# Patient Record
Sex: Female | Born: 1980
Health system: Southern US, Community
[De-identification: ages and names within clinical notes are randomized; demographics above are authoritative.]

## PROBLEM LIST (undated history)

## (undated) DIAGNOSIS — R87619 Unspecified abnormal cytological findings in specimens from cervix uteri: Secondary | ICD-10-CM

## (undated) DIAGNOSIS — IMO0002 Reserved for concepts with insufficient information to code with codable children: Secondary | ICD-10-CM

## (undated) DIAGNOSIS — K649 Unspecified hemorrhoids: Secondary | ICD-10-CM

## (undated) DIAGNOSIS — M533 Sacrococcygeal disorders, not elsewhere classified: Secondary | ICD-10-CM

## (undated) DIAGNOSIS — Z3480 Encounter for supervision of other normal pregnancy, unspecified trimester: Secondary | ICD-10-CM

## (undated) HISTORY — DX: Unspecified hemorrhoids: K64.9

## (undated) HISTORY — DX: Reserved for concepts with insufficient information to code with codable children: IMO0002

## (undated) HISTORY — DX: Sacrococcygeal disorders, not elsewhere classified: M53.3

## (undated) HISTORY — PX: COLPOSCOPY: SHX161

## (undated) HISTORY — PX: ADENOIDECTOMY: SUR15

## (undated) HISTORY — DX: Unspecified abnormal cytological findings in specimens from cervix uteri: R87.619

---

## 2011-04-23 ENCOUNTER — Inpatient Hospital Stay (HOSPITAL_COMMUNITY): Admission: AD | Admit: 2011-04-23 | Payer: Self-pay | Source: Ambulatory Visit | Admitting: Obstetrics and Gynecology

## 2011-08-10 LAB — OB RESULTS CONSOLE HIV ANTIBODY (ROUTINE TESTING): HIV: NONREACTIVE

## 2011-08-10 LAB — OB RESULTS CONSOLE GC/CHLAMYDIA
Chlamydia: NEGATIVE
Gonorrhea: NEGATIVE

## 2011-08-10 LAB — OB RESULTS CONSOLE RPR: RPR: NONREACTIVE

## 2012-01-24 LAB — OB RESULTS CONSOLE GBS: GBS: NEGATIVE

## 2012-02-07 ENCOUNTER — Telehealth (HOSPITAL_COMMUNITY): Payer: Self-pay | Admitting: *Deleted

## 2012-02-07 ENCOUNTER — Encounter (HOSPITAL_COMMUNITY): Payer: Self-pay | Admitting: *Deleted

## 2012-02-07 NOTE — Telephone Encounter (Signed)
Preadmission screen  

## 2012-02-10 ENCOUNTER — Telehealth (HOSPITAL_COMMUNITY): Payer: Self-pay | Admitting: *Deleted

## 2012-02-10 ENCOUNTER — Encounter (HOSPITAL_COMMUNITY): Payer: Self-pay | Admitting: *Deleted

## 2012-02-10 NOTE — Telephone Encounter (Signed)
Preadmission screen  

## 2012-02-16 ENCOUNTER — Encounter (HOSPITAL_COMMUNITY): Payer: Self-pay | Admitting: *Deleted

## 2012-02-16 ENCOUNTER — Inpatient Hospital Stay (HOSPITAL_COMMUNITY): Payer: No Typology Code available for payment source | Admitting: Anesthesiology

## 2012-02-16 ENCOUNTER — Inpatient Hospital Stay (HOSPITAL_COMMUNITY)
Admission: AD | Admit: 2012-02-16 | Discharge: 2012-02-18 | DRG: 775 | Disposition: A | Discharge status: Home / Self Care | Payer: No Typology Code available for payment source | Source: Ambulatory Visit | Attending: Obstetrics and Gynecology | Admitting: Obstetrics and Gynecology

## 2012-02-16 ENCOUNTER — Encounter (HOSPITAL_COMMUNITY): Payer: Self-pay | Admitting: Anesthesiology

## 2012-02-16 DIAGNOSIS — Z3480 Encounter for supervision of other normal pregnancy, unspecified trimester: Secondary | ICD-10-CM

## 2012-02-16 HISTORY — DX: Encounter for supervision of other normal pregnancy, unspecified trimester: Z34.80

## 2012-02-16 LAB — CBC
MCV: 89.4 fL (ref 78.0–100.0)
Platelets: 173 10*3/uL (ref 150–400)
RBC: 3.59 MIL/uL — ABNORMAL LOW (ref 3.87–5.11)
RDW: 13.5 % (ref 11.5–15.5)
WBC: 8.7 10*3/uL (ref 4.0–10.5)

## 2012-02-16 LAB — POCT FERN TEST: POCT Fern Test: POSITIVE

## 2012-02-16 MED ORDER — ACETAMINOPHEN 325 MG PO TABS: 650.0000 mg | ORAL_TABLET | ORAL | Status: DC | PRN

## 2012-02-16 MED ORDER — WITCH HAZEL-GLYCERIN EX PADS
1.0000 "application " | MEDICATED_PAD | CUTANEOUS | Status: DC | PRN
  Administered 2012-02-16 – 2012-02-18 (×2): 1 via TOPICAL

## 2012-02-16 MED ORDER — LACTATED RINGERS IV SOLN
500.0000 mL | INTRAVENOUS | Status: DC | PRN
  Administered 2012-02-16: 1000 mL via INTRAVENOUS

## 2012-02-16 MED ORDER — BENZOCAINE-MENTHOL 20-0.5 % EX AERO
1.0000 "application " | INHALATION_SPRAY | CUTANEOUS | Status: DC | PRN
  Administered 2012-02-16: 1 via TOPICAL
  Filled 2012-02-16: qty 56

## 2012-02-16 MED ORDER — OXYCODONE-ACETAMINOPHEN 5-325 MG PO TABS
1.0000 | ORAL_TABLET | ORAL | Status: DC | PRN
  Administered 2012-02-17 – 2012-02-18 (×2): 1 via ORAL
  Filled 2012-02-16 (×2): qty 1

## 2012-02-16 MED ORDER — OXYTOCIN 40 UNITS IN LACTATED RINGERS INFUSION - SIMPLE MED: 62.5000 mL/h | INTRAVENOUS | Status: AC | PRN

## 2012-02-16 MED ORDER — PSEUDOEPHEDRINE HCL 30 MG PO TABS: 30.0000 mg | ORAL_TABLET | Freq: Every day | ORAL | Status: DC | PRN

## 2012-02-16 MED ORDER — DIPHENHYDRAMINE HCL 25 MG PO CAPS: 25.0000 mg | ORAL_CAPSULE | Freq: Four times a day (QID) | ORAL | Status: DC | PRN

## 2012-02-16 MED ORDER — OXYCODONE-ACETAMINOPHEN 5-325 MG PO TABS: 1.0000 | ORAL_TABLET | ORAL | Status: DC | PRN

## 2012-02-16 MED ORDER — SENNOSIDES-DOCUSATE SODIUM 8.6-50 MG PO TABS
2.0000 | ORAL_TABLET | Freq: Every day | ORAL | Status: DC
  Administered 2012-02-17 (×2): 2 via ORAL

## 2012-02-16 MED ORDER — ONDANSETRON HCL 4 MG PO TABS: 4.0000 mg | ORAL_TABLET | ORAL | Status: DC | PRN

## 2012-02-16 MED ORDER — ONDANSETRON HCL 4 MG/2ML IJ SOLN: 4.0000 mg | INTRAMUSCULAR | Status: DC | PRN

## 2012-02-16 MED ORDER — EPHEDRINE 5 MG/ML INJ
10.0000 mg | INTRAVENOUS | Status: DC | PRN
  Filled 2012-02-16: qty 4

## 2012-02-16 MED ORDER — OXYTOCIN 40 UNITS IN LACTATED RINGERS INFUSION - SIMPLE MED
1.0000 m[IU]/min | INTRAVENOUS | Status: DC
  Administered 2012-02-16: 2 m[IU]/min via INTRAVENOUS

## 2012-02-16 MED ORDER — LANOLIN HYDROUS EX OINT: TOPICAL_OINTMENT | CUTANEOUS | Status: DC | PRN

## 2012-02-16 MED ORDER — OXYTOCIN 40 UNITS IN LACTATED RINGERS INFUSION - SIMPLE MED
62.5000 mL/h | INTRAVENOUS | Status: DC
  Filled 2012-02-16: qty 1000

## 2012-02-16 MED ORDER — ZOLPIDEM TARTRATE 5 MG PO TABS: 5.0000 mg | ORAL_TABLET | Freq: Every evening | ORAL | Status: DC | PRN

## 2012-02-16 MED ORDER — PRENATAL MULTIVITAMIN CH
1.0000 | ORAL_TABLET | Freq: Every day | ORAL | Status: DC
  Administered 2012-02-17 – 2012-02-18 (×2): 1 via ORAL
  Filled 2012-02-16 (×2): qty 1

## 2012-02-16 MED ORDER — FLEET ENEMA 7-19 GM/118ML RE ENEM: 1.0000 | ENEMA | Freq: Once | RECTAL | Status: DC

## 2012-02-16 MED ORDER — IBUPROFEN 600 MG PO TABS: 600.0000 mg | ORAL_TABLET | Freq: Four times a day (QID) | ORAL | Status: DC | PRN

## 2012-02-16 MED ORDER — DIBUCAINE 1 % RE OINT: 1.0000 "application " | TOPICAL_OINTMENT | RECTAL | Status: DC | PRN

## 2012-02-16 MED ORDER — LIDOCAINE HCL (PF) 1 % IJ SOLN: 30.0000 mL | INTRAMUSCULAR | Status: DC | PRN

## 2012-02-16 MED ORDER — DIPHENHYDRAMINE HCL 50 MG/ML IJ SOLN: 12.5000 mg | INTRAMUSCULAR | Status: DC | PRN

## 2012-02-16 MED ORDER — PHENYLEPHRINE 40 MCG/ML (10ML) SYRINGE FOR IV PUSH (FOR BLOOD PRESSURE SUPPORT): 80.0000 ug | PREFILLED_SYRINGE | INTRAVENOUS | Status: DC | PRN

## 2012-02-16 MED ORDER — LACTATED RINGERS IV SOLN: 500.0000 mL | Freq: Once | INTRAVENOUS | Status: DC

## 2012-02-16 MED ORDER — IBUPROFEN 600 MG PO TABS
600.0000 mg | ORAL_TABLET | Freq: Four times a day (QID) | ORAL | Status: DC
  Administered 2012-02-16 – 2012-02-18 (×7): 600 mg via ORAL
  Filled 2012-02-16 (×8): qty 1

## 2012-02-16 MED ORDER — SIMETHICONE 80 MG PO CHEW: 80.0000 mg | CHEWABLE_TABLET | ORAL | Status: DC | PRN

## 2012-02-16 MED ORDER — BUTORPHANOL TARTRATE 1 MG/ML IJ SOLN: 1.0000 mg | INTRAMUSCULAR | Status: DC | PRN

## 2012-02-16 MED ORDER — PHENYLEPHRINE 40 MCG/ML (10ML) SYRINGE FOR IV PUSH (FOR BLOOD PRESSURE SUPPORT)
80.0000 ug | PREFILLED_SYRINGE | INTRAVENOUS | Status: DC | PRN
  Filled 2012-02-16: qty 5

## 2012-02-16 MED ORDER — CITRIC ACID-SODIUM CITRATE 334-500 MG/5ML PO SOLN: 30.0000 mL | ORAL | Status: DC | PRN

## 2012-02-16 MED ORDER — TERBUTALINE SULFATE 1 MG/ML IJ SOLN: 0.2500 mg | Freq: Once | INTRAMUSCULAR | Status: DC | PRN

## 2012-02-16 MED ORDER — FENTANYL 2.5 MCG/ML BUPIVACAINE 1/10 % EPIDURAL INFUSION (WH - ANES)
14.0000 mL/h | INTRAMUSCULAR | Status: DC
  Administered 2012-02-16: 14 mL/h via EPIDURAL
  Filled 2012-02-16: qty 125

## 2012-02-16 MED ORDER — ONDANSETRON HCL 4 MG/2ML IJ SOLN: 4.0000 mg | Freq: Four times a day (QID) | INTRAMUSCULAR | Status: DC | PRN

## 2012-02-16 MED ORDER — OXYTOCIN BOLUS FROM INFUSION: 500.0000 mL | INTRAVENOUS | Status: DC

## 2012-02-16 MED ORDER — TETANUS-DIPHTH-ACELL PERTUSSIS 5-2.5-18.5 LF-MCG/0.5 IM SUSP: 0.5000 mL | Freq: Once | INTRAMUSCULAR | Status: DC

## 2012-02-16 MED ORDER — MEASLES, MUMPS & RUBELLA VAC ~~LOC~~ INJ
0.5000 mL | INJECTION | Freq: Once | SUBCUTANEOUS | Status: DC
  Filled 2012-02-16: qty 0.5

## 2012-02-16 MED ORDER — LIDOCAINE HCL (PF) 1 % IJ SOLN
INTRAMUSCULAR | Status: DC | PRN
  Administered 2012-02-16 (×4): 4 mL

## 2012-02-16 MED ORDER — LACTATED RINGERS IV SOLN: INTRAVENOUS | Status: AC

## 2012-02-16 MED ORDER — LACTATED RINGERS IV SOLN
INTRAVENOUS | Status: DC
  Administered 2012-02-16 (×2): via INTRAVENOUS

## 2012-02-16 MED ORDER — PRENATAL MULTIVITAMIN CH: 1.0000 | ORAL_TABLET | Freq: Every day | ORAL | Status: DC

## 2012-02-16 NOTE — Anesthesia Postprocedure Evaluation (Signed)
  Anesthesia Post-op Note  Patient: Yolanda Christian  Procedure(s) Performed: * No procedures listed *  Patient Location: PACU and Mother/Baby  Anesthesia Type:Epidural  Level of Consciousness: awake, alert  and oriented  Airway and Oxygen Therapy: Patient Spontanous Breathing  Post-op Pain: mild  Post-op Assessment: Post-op Vital signs reviewed  Post-op Vital Signs: Reviewed and stable  Complications: No apparent anesthesia complications

## 2012-02-16 NOTE — Progress Notes (Signed)
Patient ID: Yolanda Christian, female   DOB: 1981-01-31, 31 y.o.   MRN: [id number] Contractions q 1-2 and 1/2 minutes. The cervix is 6 cm 100% effaced and the vertex is at - 1 station.

## 2012-02-16 NOTE — Progress Notes (Signed)
Patient ID: Yolanda Christian, female   DOB: 05-15-1980, 31 y.o.   MRN: [id number] Contractions q 3-4 minutes and pt has her epidural. The cervix is 4 cm 80% effaced and the vertex is at - 2 station. The cervix was 3-4 cm and 75 % effaced on admission at 2:30 AM so will start pitoocin

## 2012-02-16 NOTE — Progress Notes (Signed)
Patient ID: Yolanda Christian, female   DOB: [birth year], 31 y.o.   MRN: [id number] Delivery note.   The pt gradually brought the vertex to the perineum and delivered a living female infant LOP over a small second degree laceratiion. The Apgars were 9 and 9 at 1 and 5 minutes and the placenta delivered intact. The uterus was normal and the small laceration was repaired with 3-0 vicryl EBL 400 cc's. During labor it was noted that there is redundant tissue on the left perineum

## 2012-02-16 NOTE — Progress Notes (Signed)
Patient ID: Yolanda Christian, female   DOB: 11-May-1980, 31 y.o.   MRN: [id number] Py became fully dilated at 10:20 AM and is pushing with minimal descent so far.

## 2012-02-16 NOTE — Anesthesia Preprocedure Evaluation (Signed)

## 2012-02-16 NOTE — H&P (Signed)
Yolanda Christian is a 31 y.o. female G2P1001 at 39+ with SROM, clear fluid.  Uncomplicated prenatal care except isolated EIF on [place].  +FM, + LOF, no VB, occ ctx.   Maternal Medical History:  Reason for admission: Reason for admission: rupture of membranes.  Contractions: Frequency: irregular.    Fetal activity: Perceived fetal activity is normal.      OB History    Grav Para Term Preterm Abortions TAB SAB Ect Mult Living   2 1 1       1     G1 7#5, female, SVD post-dates; G2 present, + abn pap, no STDs. Past Medical History  Diagnosis Date  . Abnormal Pap smear   . Hemorrhoids   . Coccyx disorder     positional tender on/off  . Normal pregnancy, repeat 02/16/2012   Past Surgical History  Procedure Date  . Colposcopy   . Adenoidectomy    Family History: family history includes Cancer in her maternal grandfather; Diabetes in her father; and Hypertension in her father. Social History:  reports that she has never smoked. She has never used smokeless tobacco. She reports that she does not drink alcohol or use illicit drugs.married Meds PNV All NKDA   Prenatal Transfer Tool  Maternal Diabetes: No Genetic Screening: Declined Maternal Ultrasounds/Referrals: Abnormal:  Findings:   Isolated EIF (echogenic intracardiac focus) Fetal Ultrasounds or other Referrals:  None Maternal Substance Abuse:  No Significant Maternal Medications:  None Significant Maternal Lab Results:  Lab values include: Group B Strep negative Other Comments:  None  Review of Systems  Constitutional: Negative.   HENT: Negative.   Eyes: Negative.   Respiratory: Negative.   Cardiovascular: Negative.   Gastrointestinal: Negative.   Genitourinary: Negative.   Musculoskeletal: Negative.   Skin: Negative.   Neurological: Negative.   Psychiatric/Behavioral: Negative.     Dilation: 3.5 Effacement (%): 70 Station: -2 Exam by:: M.Topp,RN Blood pressure 138/78, pulse 87, temperature 98 F (36.7 C), resp. rate  20, height 5\' 7"  (1.702 m), weight 81.647 kg (180 lb), last menstrual period 05/26/2011, SpO2 100.00%. Maternal Exam:  Uterine Assessment: Contraction strength is moderate.  Contraction frequency is irregular.   Abdomen: Fundal height is appropriate for gestation.   Estimated fetal weight is 7-8#.   Fetal presentation: vertex  Introitus: Normal vulva. Normal vagina.  Ferning test: positive.   Pelvis: adequate for delivery.      Physical Exam  Constitutional: She is oriented to person, place, and time. She appears well-developed and well-nourished.  HENT:  Head: Normocephalic and atraumatic.  Eyes: Pupils are equal, round, and reactive to light.  Neck: Normal range of motion. Neck supple. No thyromegaly present.  Cardiovascular: Normal rate and regular rhythm.   Respiratory: Effort normal and breath sounds normal. No respiratory distress.  GI: Soft. Bowel sounds are normal. There is no tenderness.  Musculoskeletal: Normal range of motion.  Neurological: She is alert and oriented to person, place, and time.  Skin: Skin is warm and dry.  Psychiatric: She has a normal mood and affect. Her behavior is normal.    Prenatal labs: ABO, Rh: O/Positive/-- (05/14 0000) Antibody: Negative (05/14 0000) Rubella: Immune (05/14 0000) RPR: Nonreactive (05/14 0000)  HBsAg: Negative (05/14 0000)  HIV: Non-reactive (05/14 0000)  GBS: Negative (10/28 0000)  Hgb 12.8/ Pap WNL/ Ur Cx neg/ Plt 287K/ GC neg/ Chl neg/ glucola 117/  Flu 9/27; Tdap 10/13  [place] to date, first trimester Anat - isolated EIF, nl anat, cwd, female, post plac  Assessment/Plan: 31yo G2P1001 with SROM for augmentation of labor Pitocin prn Epidural prn Expect SVD    BOVARD,Jenean Escandon 02/16/2012, 6:53 AM

## 2012-02-16 NOTE — Anesthesia Procedure Notes (Addendum)
Epidural Patient location during procedure: OB Start time: 02/16/2012 7:01 AM  Staffing Performed by: anesthesiologist   Preanesthetic Checklist Completed: patient identified, site marked, surgical consent, pre-op evaluation, timeout performed, IV checked, risks and benefits discussed and monitors and equipment checked  Epidural Patient position: sitting Prep: site prepped and draped and DuraPrep Patient monitoring: continuous pulse ox and blood pressure Approach: midline Injection technique: LOR air  Needle:  Needle type: Tuohy  Needle gauge: 17 G Needle length: 9 cm and 9 Needle insertion depth: 5 cm cm Catheter type: closed end flexible Catheter size: 19 Gauge Catheter at skin depth: 10 cm Test dose: negative  Assessment Events: blood not aspirated, injection not painful, no injection resistance, negative IV test and paresthesia (transient left leg)  Additional Notes Discussed risk of headache, infection, bleeding, nerve injury and failed or incomplete block.  Patient voices understanding and wishes to proceed. Reason for block:procedure for pain

## 2012-02-17 LAB — CBC
HCT: 29.9 % — ABNORMAL LOW (ref 36.0–46.0)
MCH: 29.4 pg (ref 26.0–34.0)
MCV: 91.4 fL (ref 78.0–100.0)
Platelets: 135 10*3/uL — ABNORMAL LOW (ref 150–400)
RDW: 13.8 % (ref 11.5–15.5)
WBC: 9.1 10*3/uL (ref 4.0–10.5)

## 2012-02-17 NOTE — Progress Notes (Signed)
Patient ID: Yolanda Christian, female   DOB: January 17, 1981, 31 y.o.   MRN: [id number] #1 afebrile BP normal

## 2012-02-17 NOTE — Progress Notes (Signed)
Patient ID: Yolanda Christian, female   DOB: Jul 13, 1980, 31 y.o.   MRN: [id number] #1 afebrile BP normal no problems

## 2012-02-18 ENCOUNTER — Inpatient Hospital Stay (HOSPITAL_COMMUNITY): Admission: RE | Admit: 2012-02-18 | Payer: No Typology Code available for payment source | Source: Ambulatory Visit

## 2012-02-18 MED ORDER — IBUPROFEN 600 MG PO TABS: 600.0000 mg | ORAL_TABLET | Freq: Four times a day (QID) | ORAL | Status: DC

## 2012-02-18 MED ORDER — OXYCODONE-ACETAMINOPHEN 5-325 MG PO TABS: 1.0000 | ORAL_TABLET | Freq: Four times a day (QID) | ORAL | Status: DC | PRN

## 2012-02-18 NOTE — Discharge Summary (Signed)
NAMEMarland Kitchen  KOBIE, MATKINS NO.:  [id number]  MEDICAL RECORD NO.:  [id number]  LOCATION:  9110                          FACILITY:  [facility]  PHYSICIAN:  Malachi Pro. Ambrose Mantle, M.D. DATE OF BIRTH:  1981/02/14  DATE OF ADMISSION:  02/16/2012 DATE OF DISCHARGE:  02/18/2012                              DISCHARGE SUMMARY   A 31 year old white female, para 1-0-0-1, gravida 2, presented with spontaneous rupture of membranes and clear fluid.  The patient had an uncomplicated prenatal course.  She did have an echogenic intracardiac focus on ultrasound.  Blood group and type O positive, negative antibody, rubella immune, RPR nonreactive, hepatitis B surface antigen negative, HIV negative, GBS negative.  One hour Glucola 117.  GC and Chlamydia negative.  Pap smear normal.  After admission to the hospital, the patient was placed on Pitocin.  By 8:12 a.m., the contractions every 3-4 minutes.  Cervix 4 cm, 80%, vertex at a -2.  Cervix had been 3-4 cm and 75%.  On admission at 2:30 a.m., the Pitocin was begun.  At 9 a.m. the cervix was 6 cm, 100% vertex at a -1.  She became fully dilated at 10:20 a.m. and began pushing and initially made an minimal descent that she gradually brought the vertex to the perineum, delivered a living female infant, LOP over a small second-degree laceration by Dr. Ambrose Mantle. Apgars were 9 and 9 at 1 and 5 minutes.  Placenta delivered intact. Uterus normal and the small laceration was repaired with 3-0 Vicryl. Blood loss 400 mL.  During labor, it was noted that there was redundant tissue on the left side of the perineum.  Postpartum the patient did well and was discharged on the second postpartum day.  LABORATORY DATA:  Initial hemoglobin of 10.5, hematocrit 32.1, white count 8700, platelet count of 173,000, RPR nonreactive, followup hemoglobin 9.6, hematocrit 29.9.  FINAL DIAGNOSES:  Intrauterine pregnancy 39+ weeks, delivered left occiput posterior.  OPERATIONS:   Spontaneous delivery LOP, repair of small second-degree laceration.  FINAL CONDITION:  Improved.  Instructions include our regular discharge instruction booklet as well as the after visit summary.  She is given prescriptions for Motrin and Percocet 15 of each, 1 every 6 hours as needed for pain and she is advised to take iron pills once daily and return in 6 weeks for followup examination.     Malachi Pro. Ambrose Mantle, M.D.     TFH/MEDQ  D:  02/18/2012  T:  02/18/2012  Job:  [id number]

## 2012-02-18 NOTE — Progress Notes (Signed)
Patient ID: Yolanda Christian, female   DOB: [birth year], 31 y.o.   MRN: [id number] #2 afebrile for d/c No complaints

## 2012-02-21 ENCOUNTER — Encounter (HOSPITAL_COMMUNITY): Payer: Self-pay | Admitting: *Deleted

## 2013-08-31 ENCOUNTER — Emergency Department (INDEPENDENT_AMBULATORY_CARE_PROVIDER_SITE_OTHER)
Admission: EM | Admit: 2013-08-31 | Discharge: 2013-08-31 | Disposition: A | Discharge status: Home / Self Care | Payer: BC Managed Care – PPO | Source: Home / Self Care | Attending: Emergency Medicine | Admitting: Emergency Medicine

## 2013-08-31 ENCOUNTER — Encounter (HOSPITAL_COMMUNITY): Payer: Self-pay | Admitting: Emergency Medicine

## 2013-08-31 DIAGNOSIS — M26649 Arthritis of unspecified temporomandibular joint: Secondary | ICD-10-CM

## 2013-08-31 DIAGNOSIS — M2669 Other specified disorders of temporomandibular joint: Secondary | ICD-10-CM

## 2013-08-31 MED ORDER — DICLOFENAC SODIUM 75 MG PO TBEC: 75.0000 mg | DELAYED_RELEASE_TABLET | Freq: Two times a day (BID) | ORAL | Status: DC

## 2013-08-31 MED ORDER — CYCLOBENZAPRINE HCL 10 MG PO TABS: ORAL_TABLET | ORAL | Status: DC

## 2013-08-31 MED ORDER — TRIAMCINOLONE ACETONIDE 0.1 % EX CREA: 1.0000 "application " | TOPICAL_CREAM | Freq: Three times a day (TID) | CUTANEOUS | Status: DC

## 2013-08-31 MED ORDER — PREDNISONE 20 MG PO TABS: 20.0000 mg | ORAL_TABLET | Freq: Two times a day (BID) | ORAL | Status: DC

## 2013-08-31 NOTE — Discharge Instructions (Signed)
Temporomandibular Problems  Temporomandibular joint (TMJ) dysfunction means there are problems with the joint between your jaw and your skull. This is a joint lined by cartilage like other joints in your body but also has a small disc in the joint which keeps the bones from rubbing on each other. These joints are like other joints and can get inflamed (sore) from arthritis and other problems. When this joint gets sore, it can cause headaches and pain in the jaw and the face. CAUSES  Usually the arthritic types of problems are caused by soreness in the joint. Soreness in the joint can also be caused by overuse. This may come from grinding your teeth. It may also come from mis-alignment in the joint. DIAGNOSIS Diagnosis of this condition can often be made by history and exam. Sometimes your caregiver may need X-rays or an MRI scan to determine the exact cause. It may be necessary to see your dentist to determine if your teeth and jaws are lined up correctly. TREATMENT  Most of the time this problem is not serious; however, sometimes it can persist (become chronic). When this happens medications that will cut down on inflammation (soreness) help. Sometimes a shot of cortisone into the joint will be helpful. If your teeth are not aligned it may help for your dentist to make a splint for your mouth that can help this problem. If no physical problems can be found, the problem may come from tension. If tension is found to be the cause, biofeedback or relaxation techniques may be helpful. HOME CARE INSTRUCTIONS   Later in the day, applications of ice packs may be helpful. Ice can be used in a plastic bag with a towel around it to prevent frostbite to skin. This may be used about every 2 hours for 20 to 30 minutes, as needed while awake, or as directed by your caregiver.  Only take over-the-counter or prescription medicines for pain, discomfort, or fever as directed by your caregiver.  If physical therapy was  prescribed, follow your caregiver's directions.  Wear mouth appliances as directed if they were given. Document Released: 12/08/2000 Document Revised: 06/07/2011 Document Reviewed: 03/17/2008 [facility] Patient Information 2014 [place], [place].    Jaw Range of Motion Exercises Do the exercises below to prevent problems opening your mouth after a jaw fracture, TMJ, or surgery. HOME CARE Warm up  Open your mouth as wide as possible for 15 seconds.  Then, close your mouth and rest. Repeat this 8 times. Exercises  Stick your jaw forward for 15 seconds. Rest your jaw. Repeat this 8 times.  Move your jaw right for 15 seconds. Rest your jaw and repeat 8 times.  Move your jaw left for 15 seconds. Rest your jaw and repeat 8 times.  Put your middle finger and thumb in your mouth. Push with your thumb on your top teeth and your middle finger on your bottom teeth opening your mouth.  Stretch your mouth open as far as possible. Repeat 8 times.  Chew gum when you are not doing exercises.  Use moist heat packs 3 to 4 times a day on your jaw area. Document Released: 02/26/2008 Document Revised: 06/07/2011 Document Reviewed: 02/26/2008 [facility] Patient Information 2014 [place].

## 2013-08-31 NOTE — ED Provider Notes (Signed)
Chief Complaint   Chief Complaint  Patient presents with  . Jaw Pain    History of Present Illness   Yolanda Christian is a 33 year old female who has had a two-day history of pain and swelling around the right TMJ. She denies any injury to the area. There is no erythema, fever, chills, headache, or stiff neck. It does not hurt to open or close her mouth and she has no pain with chewing or biting. She denies any ear pain or drainage. There no painful teeth and no swelling inside the mouth. She denies any sore throat or adenopathy. She's had a long-standing history of a click in her right jaw, but has never given her much problem the past.  Review of Systems   Other than as noted above, the patient denies any of the following symptoms: Systemic:  No fevers or chills. Eye:  No redness, pain, discharge, itching, blurred vision, or diplopia. ENT:  No headache, nasal congestion, sneezing, itching, epistaxis, ear pain, decreased hearing, ringing in ears, vertigo, or tinnitus.  No oral lesions, sore throat, or hoarseness. Neck:  No neck pain or adenopathy. Skin:  No rash or itching.  [place]   Past medical history, family history, social history, meds, and allergies were reviewed.   Physical Examination     Vital signs:  BP 108/73  Pulse 71  Temp(Src) 97.7 F (36.5 C) (Oral)  Resp 18  SpO2 100%  LMP 08/25/2013  Breastfeeding? No General:  Alert and oriented.  In no distress.  Skin warm and dry. Eye:  PERRL, full EOMs, lids and conjunctiva normal.   ENT:  TMs and canals clear.  Nasal mucosa not congested and without drainage.  Mucous membranes moist, no oral lesions, normal dentition, pharynx clear.  No cranial or facial pain to palplation. Exam of the jaw area reveals no external swelling, no mass, the parotid gland is not enlarged. There is slight tenderness to palpation over the right TMJ and she has a loud, palpable click. She's able to open her mouth fully. There no intraoral lesions,  no obviously decayed teeth. Neck:  Supple, full ROM.  No adenopathy, tenderness or mass.  Thyroid normal. Lungs:  Breath sounds clear and equal bilaterally.  No wheezes, rales or rhonchi. Heart:  Rhythm regular, without extrasystoles.  No gallops or murmers. Skin:  Clear, warm and dry. She had an erythematous rash on her right arm. She attributes this to poison ivy.  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  Plan    1.  Meds:  The following meds were prescribed:   Discharge Medication List as of 08/31/2013  7:18 PM    START taking these medications   Details  cyclobenzaprine (FLEXERIL) 10 MG tablet Take 1 at bedtime, Normal    diclofenac (VOLTAREN) 75 MG EC tablet Take 1 tablet (75 mg total) by mouth 2 (two) times daily., Starting 08/31/2013, Until Discontinued, Normal    predniSONE (DELTASONE) 20 MG tablet Take 1 tablet (20 mg total) by mouth 2 (two) times daily., Starting 08/31/2013, Until Discontinued, Normal    triamcinolone cream (KENALOG) 0.1 % Apply 1 application topically 3 (three) times daily., Starting 08/31/2013, Until Discontinued, Normal        2.  Patient Education/Counseling:  The patient was given appropriate handouts, self care instructions, and instructed in symptomatic relief.  Suggested soft diet, range of motion exercises, and followup with her dentist in a week.  3.  Follow up:  The patient was told  to follow up here if no better in 3 to 4 days, or sooner if becoming worse in any way, and given some red flag symptoms such as fever, swelling, erythema, or worsening pain which would prompt immediate return.       Harden Mo, MD 08/31/13 2138

## 2013-08-31 NOTE — ED Notes (Signed)
Pt c/o constant pain on right side of jaw onset 2 days Sx include swelling Does not hurt to eat Taking ibup w/no relief; last had around 1800

## 2014-01-15 LAB — OB RESULTS CONSOLE GC/CHLAMYDIA
Chlamydia: NEGATIVE
Gonorrhea: NEGATIVE

## 2014-01-21 LAB — OB RESULTS CONSOLE HEPATITIS B SURFACE ANTIGEN: HEP B S AG: NEGATIVE

## 2014-01-28 ENCOUNTER — Encounter (HOSPITAL_COMMUNITY): Payer: Self-pay | Admitting: Emergency Medicine

## 2014-02-18 LAB — OB RESULTS CONSOLE HIV ANTIBODY (ROUTINE TESTING): HIV: NONREACTIVE

## 2014-02-18 LAB — OB RESULTS CONSOLE RUBELLA ANTIBODY, IGM: Rubella: NON-IMMUNE/NOT IMMUNE

## 2014-06-18 ENCOUNTER — Inpatient Hospital Stay (HOSPITAL_COMMUNITY): Payer: 59 | Admitting: Anesthesiology

## 2014-06-18 ENCOUNTER — Inpatient Hospital Stay (HOSPITAL_COMMUNITY): Payer: 59

## 2014-06-18 ENCOUNTER — Inpatient Hospital Stay (HOSPITAL_COMMUNITY)
Admission: AD | Admit: 2014-06-18 | Discharge: 2014-06-21 | DRG: 765 | Disposition: A | Discharge status: Home / Self Care | Payer: 59 | Source: Ambulatory Visit | Attending: Obstetrics and Gynecology | Admitting: Obstetrics and Gynecology

## 2014-06-18 ENCOUNTER — Encounter (HOSPITAL_COMMUNITY)
Admission: AD | Disposition: A | Discharge status: Home / Self Care | Payer: Self-pay | Source: Ambulatory Visit | Attending: Obstetrics and Gynecology

## 2014-06-18 ENCOUNTER — Encounter (HOSPITAL_COMMUNITY): Payer: Self-pay | Admitting: *Deleted

## 2014-06-18 DIAGNOSIS — O42913 Preterm premature rupture of membranes, unspecified as to length of time between rupture and onset of labor, third trimester: Principal | ICD-10-CM | POA: Diagnosis present

## 2014-06-18 DIAGNOSIS — Z833 Family history of diabetes mellitus: Secondary | ICD-10-CM

## 2014-06-18 DIAGNOSIS — O30043 Twin pregnancy, dichorionic/diamniotic, third trimester: Secondary | ICD-10-CM | POA: Diagnosis present

## 2014-06-18 DIAGNOSIS — O322XX2 Maternal care for transverse and oblique lie, fetus 2: Secondary | ICD-10-CM | POA: Diagnosis present

## 2014-06-18 DIAGNOSIS — O30003 Twin pregnancy, unspecified number of placenta and unspecified number of amniotic sacs, third trimester: Secondary | ICD-10-CM | POA: Diagnosis present

## 2014-06-18 DIAGNOSIS — O321XX2 Maternal care for breech presentation, fetus 2: Secondary | ICD-10-CM

## 2014-06-18 DIAGNOSIS — O321XX0 Maternal care for breech presentation, not applicable or unspecified: Secondary | ICD-10-CM | POA: Insufficient documentation

## 2014-06-18 DIAGNOSIS — Z3A35 35 weeks gestation of pregnancy: Secondary | ICD-10-CM | POA: Diagnosis present

## 2014-06-18 DIAGNOSIS — O329XX2 Maternal care for malpresentation of fetus, unspecified, fetus 2: Secondary | ICD-10-CM

## 2014-06-18 DIAGNOSIS — Z98891 History of uterine scar from previous surgery: Secondary | ICD-10-CM

## 2014-06-18 LAB — CBC
HCT: 29.8 % — ABNORMAL LOW (ref 36.0–46.0)
Hemoglobin: 9.5 g/dL — ABNORMAL LOW (ref 12.0–15.0)
MCH: 27.1 pg (ref 26.0–34.0)
MCHC: 31.9 g/dL (ref 30.0–36.0)
MCV: 84.9 fL (ref 78.0–100.0)
Platelets: 143 10*3/uL — ABNORMAL LOW (ref 150–400)
RBC: 3.51 MIL/uL — AB (ref 3.87–5.11)
RDW: 14.4 % (ref 11.5–15.5)
WBC: 9.7 10*3/uL (ref 4.0–10.5)

## 2014-06-18 LAB — RPR: RPR: NONREACTIVE

## 2014-06-18 LAB — PREPARE RBC (CROSSMATCH)

## 2014-06-18 LAB — GROUP B STREP BY PCR: Group B strep by PCR: NEGATIVE

## 2014-06-18 LAB — OB RESULTS CONSOLE GBS: GBS: NEGATIVE

## 2014-06-18 SURGERY — Surgical Case
Anesthesia: Epidural

## 2014-06-18 MED ORDER — OXYTOCIN 40 UNITS IN LACTATED RINGERS INFUSION - SIMPLE MED
62.5000 mL/h | INTRAVENOUS | Status: DC
  Filled 2014-06-18: qty 1000

## 2014-06-18 MED ORDER — METOCLOPRAMIDE HCL 5 MG/ML IJ SOLN
INTRAMUSCULAR | Status: AC
  Filled 2014-06-18: qty 2

## 2014-06-18 MED ORDER — HYDROMORPHONE HCL 1 MG/ML IJ SOLN
INTRAMUSCULAR | Status: AC
  Filled 2014-06-18: qty 1

## 2014-06-18 MED ORDER — SIMETHICONE 80 MG PO CHEW
80.0000 mg | CHEWABLE_TABLET | Freq: Three times a day (TID) | ORAL | Status: DC
  Administered 2014-06-19 – 2014-06-21 (×3): 80 mg via ORAL
  Filled 2014-06-18 (×6): qty 1

## 2014-06-18 MED ORDER — FENTANYL 2.5 MCG/ML BUPIVACAINE 1/10 % EPIDURAL INFUSION (WH - ANES)
14.0000 mL/h | INTRAMUSCULAR | Status: DC | PRN
  Administered 2014-06-18: 14 mL/h via EPIDURAL

## 2014-06-18 MED ORDER — OXYTOCIN 10 UNIT/ML IJ SOLN
40.0000 [IU] | INTRAVENOUS | Status: DC | PRN
  Administered 2014-06-18: 40 [IU] via INTRAVENOUS

## 2014-06-18 MED ORDER — PHENYLEPHRINE 40 MCG/ML (10ML) SYRINGE FOR IV PUSH (FOR BLOOD PRESSURE SUPPORT): 80.0000 ug | PREFILLED_SYRINGE | INTRAVENOUS | Status: DC | PRN

## 2014-06-18 MED ORDER — MEPERIDINE HCL 25 MG/ML IJ SOLN: 6.2500 mg | INTRAMUSCULAR | Status: DC | PRN

## 2014-06-18 MED ORDER — ACETAMINOPHEN 325 MG PO TABS: 650.0000 mg | ORAL_TABLET | ORAL | Status: DC | PRN

## 2014-06-18 MED ORDER — OXYCODONE-ACETAMINOPHEN 5-325 MG PO TABS
1.0000 | ORAL_TABLET | ORAL | Status: DC | PRN
  Administered 2014-06-19 – 2014-06-21 (×3): 1 via ORAL
  Filled 2014-06-18 (×2): qty 1

## 2014-06-18 MED ORDER — LACTATED RINGERS IV SOLN
INTRAVENOUS | Status: DC
  Administered 2014-06-18 (×2): via INTRAVENOUS

## 2014-06-18 MED ORDER — MORPHINE SULFATE 0.5 MG/ML IJ SOLN
INTRAMUSCULAR | Status: AC
  Filled 2014-06-18: qty 10

## 2014-06-18 MED ORDER — ONDANSETRON HCL 4 MG/2ML IJ SOLN: 4.0000 mg | Freq: Once | INTRAMUSCULAR | Status: DC

## 2014-06-18 MED ORDER — FENTANYL CITRATE 0.05 MG/ML IJ SOLN
INTRAMUSCULAR | Status: AC
  Filled 2014-06-18: qty 5

## 2014-06-18 MED ORDER — NALBUPHINE HCL 10 MG/ML IJ SOLN: 5.0000 mg | INTRAMUSCULAR | Status: DC | PRN

## 2014-06-18 MED ORDER — LACTATED RINGERS IV SOLN
500.0000 mL | Freq: Once | INTRAVENOUS | Status: AC
  Administered 2014-06-18: 500 mL via INTRAVENOUS

## 2014-06-18 MED ORDER — LACTATED RINGERS IV SOLN: 500.0000 mL | INTRAVENOUS | Status: DC | PRN

## 2014-06-18 MED ORDER — OXYCODONE-ACETAMINOPHEN 5-325 MG PO TABS: 2.0000 | ORAL_TABLET | ORAL | Status: DC | PRN

## 2014-06-18 MED ORDER — DIPHENHYDRAMINE HCL 25 MG PO CAPS: 25.0000 mg | ORAL_CAPSULE | Freq: Four times a day (QID) | ORAL | Status: DC | PRN

## 2014-06-18 MED ORDER — DIPHENHYDRAMINE HCL 50 MG/ML IJ SOLN: 12.5000 mg | INTRAMUSCULAR | Status: DC | PRN

## 2014-06-18 MED ORDER — EPHEDRINE 5 MG/ML INJ: 10.0000 mg | INTRAVENOUS | Status: DC | PRN

## 2014-06-18 MED ORDER — KETOROLAC TROMETHAMINE 30 MG/ML IJ SOLN
30.0000 mg | Freq: Four times a day (QID) | INTRAMUSCULAR | Status: AC | PRN
  Administered 2014-06-18: 30 mg via INTRAMUSCULAR

## 2014-06-18 MED ORDER — ZOLPIDEM TARTRATE 5 MG PO TABS: 5.0000 mg | ORAL_TABLET | Freq: Every evening | ORAL | Status: DC | PRN

## 2014-06-18 MED ORDER — SODIUM CHLORIDE 0.9 % IJ SOLN: 3.0000 mL | INTRAMUSCULAR | Status: DC | PRN

## 2014-06-18 MED ORDER — MEASLES, MUMPS & RUBELLA VAC ~~LOC~~ INJ
0.5000 mL | INJECTION | Freq: Once | SUBCUTANEOUS | Status: DC
  Filled 2014-06-18: qty 0.5

## 2014-06-18 MED ORDER — HYDROMORPHONE HCL 1 MG/ML IJ SOLN
0.2500 mg | INTRAMUSCULAR | Status: DC | PRN
  Administered 2014-06-18 (×3): 0.5 mg via INTRAVENOUS

## 2014-06-18 MED ORDER — OXYTOCIN BOLUS FROM INFUSION
500.0000 mL | INTRAVENOUS | Status: DC
Start: 2014-06-18 — End: 2014-06-21

## 2014-06-18 MED ORDER — ONDANSETRON HCL 4 MG/2ML IJ SOLN
4.0000 mg | Freq: Three times a day (TID) | INTRAMUSCULAR | Status: DC | PRN
  Administered 2014-06-18: 4 mg via INTRAVENOUS
  Filled 2014-06-18: qty 2

## 2014-06-18 MED ORDER — MIDAZOLAM HCL 2 MG/2ML IJ SOLN
INTRAMUSCULAR | Status: AC
  Filled 2014-06-18: qty 2

## 2014-06-18 MED ORDER — OXYCODONE-ACETAMINOPHEN 5-325 MG PO TABS
2.0000 | ORAL_TABLET | ORAL | Status: DC | PRN
Start: 2014-06-18 — End: 2014-06-21
  Administered 2014-06-19 – 2014-06-20 (×5): 2 via ORAL
  Filled 2014-06-18 (×6): qty 2

## 2014-06-18 MED ORDER — DIBUCAINE 1 % RE OINT: 1.0000 "application " | TOPICAL_OINTMENT | RECTAL | Status: DC | PRN

## 2014-06-18 MED ORDER — IBUPROFEN 600 MG PO TABS
600.0000 mg | ORAL_TABLET | Freq: Four times a day (QID) | ORAL | Status: DC
  Administered 2014-06-19 – 2014-06-21 (×8): 600 mg via ORAL
  Filled 2014-06-18 (×10): qty 1

## 2014-06-18 MED ORDER — LACTATED RINGERS IV SOLN
INTRAVENOUS | Status: DC
  Administered 2014-06-18 – 2014-06-19 (×2): via INTRAVENOUS

## 2014-06-18 MED ORDER — FENTANYL CITRATE 0.05 MG/ML IJ SOLN: INTRAMUSCULAR | Status: DC | PRN

## 2014-06-18 MED ORDER — SCOPOLAMINE 1 MG/3DAYS TD PT72
MEDICATED_PATCH | TRANSDERMAL | Status: AC
  Filled 2014-06-18: qty 1

## 2014-06-18 MED ORDER — SCOPOLAMINE 1 MG/3DAYS TD PT72: 1.0000 | MEDICATED_PATCH | Freq: Once | TRANSDERMAL | Status: DC

## 2014-06-18 MED ORDER — KETOROLAC TROMETHAMINE 30 MG/ML IJ SOLN
INTRAMUSCULAR | Status: AC
  Filled 2014-06-18: qty 1

## 2014-06-18 MED ORDER — FENTANYL 2.5 MCG/ML BUPIVACAINE 1/10 % EPIDURAL INFUSION (WH - ANES)
INTRAMUSCULAR | Status: AC
  Administered 2014-06-18: 14 mL/h via EPIDURAL
  Filled 2014-06-18: qty 125

## 2014-06-18 MED ORDER — DIPHENHYDRAMINE HCL 25 MG PO CAPS: 25.0000 mg | ORAL_CAPSULE | ORAL | Status: DC | PRN

## 2014-06-18 MED ORDER — LANOLIN HYDROUS EX OINT: 1.0000 "application " | TOPICAL_OINTMENT | CUTANEOUS | Status: DC | PRN

## 2014-06-18 MED ORDER — LACTATED RINGERS IV SOLN
INTRAVENOUS | Status: DC | PRN
  Administered 2014-06-18: 13:00:00 via INTRAVENOUS

## 2014-06-18 MED ORDER — OXYTOCIN 10 UNIT/ML IJ SOLN
INTRAMUSCULAR | Status: AC
  Filled 2014-06-18: qty 4

## 2014-06-18 MED ORDER — SUCCINYLCHOLINE CHLORIDE 20 MG/ML IJ SOLN
INTRAMUSCULAR | Status: DC | PRN
  Administered 2014-06-18: 120 mg via INTRAVENOUS

## 2014-06-18 MED ORDER — WITCH HAZEL-GLYCERIN EX PADS: 1.0000 "application " | MEDICATED_PAD | CUTANEOUS | Status: DC | PRN

## 2014-06-18 MED ORDER — CEFAZOLIN SODIUM-DEXTROSE 2-3 GM-% IV SOLR
2.0000 g | Freq: Three times a day (TID) | INTRAVENOUS | Status: AC
  Administered 2014-06-18 – 2014-06-19 (×2): 2 g via INTRAVENOUS
  Filled 2014-06-18 (×3): qty 50

## 2014-06-18 MED ORDER — NALBUPHINE HCL 10 MG/ML IJ SOLN: 5.0000 mg | Freq: Once | INTRAMUSCULAR | Status: AC | PRN

## 2014-06-18 MED ORDER — CEFAZOLIN SODIUM-DEXTROSE 2-3 GM-% IV SOLR
INTRAVENOUS | Status: DC | PRN
  Administered 2014-06-18: 2 g via INTRAVENOUS

## 2014-06-18 MED ORDER — SENNOSIDES-DOCUSATE SODIUM 8.6-50 MG PO TABS
2.0000 | ORAL_TABLET | ORAL | Status: DC
  Administered 2014-06-19 – 2014-06-21 (×3): 2 via ORAL
  Filled 2014-06-18 (×3): qty 2

## 2014-06-18 MED ORDER — ONDANSETRON HCL 4 MG/2ML IJ SOLN
INTRAMUSCULAR | Status: AC
  Filled 2014-06-18: qty 2

## 2014-06-18 MED ORDER — MENTHOL 3 MG MT LOZG: 1.0000 | LOZENGE | OROMUCOSAL | Status: DC | PRN

## 2014-06-18 MED ORDER — METOCLOPRAMIDE HCL 5 MG/ML IJ SOLN
10.0000 mg | Freq: Once | INTRAMUSCULAR | Status: AC | PRN
  Administered 2014-06-18: 10 mg via INTRAVENOUS

## 2014-06-18 MED ORDER — LACTATED RINGERS IV SOLN: INTRAVENOUS | Status: DC

## 2014-06-18 MED ORDER — LIDOCAINE HCL (PF) 1 % IJ SOLN
30.0000 mL | INTRAMUSCULAR | Status: DC | PRN
Start: 2014-06-18 — End: 2014-06-18
  Filled 2014-06-18: qty 30

## 2014-06-18 MED ORDER — MIDAZOLAM HCL 2 MG/2ML IJ SOLN
INTRAMUSCULAR | Status: DC | PRN
  Administered 2014-06-18: 2 mg via INTRAVENOUS

## 2014-06-18 MED ORDER — FENTANYL CITRATE 0.05 MG/ML IJ SOLN
INTRAMUSCULAR | Status: AC
  Filled 2014-06-18: qty 2

## 2014-06-18 MED ORDER — SCOPOLAMINE 1 MG/3DAYS TD PT72
1.0000 | MEDICATED_PATCH | TRANSDERMAL | Status: DC
  Administered 2014-06-18: 1.5 mg via TRANSDERMAL

## 2014-06-18 MED ORDER — OXYTOCIN 40 UNITS IN LACTATED RINGERS INFUSION - SIMPLE MED: 62.5000 mL/h | INTRAVENOUS | Status: AC

## 2014-06-18 MED ORDER — LIDOCAINE HCL (PF) 1 % IJ SOLN
INTRAMUSCULAR | Status: DC | PRN
  Administered 2014-06-18 (×2): 5 mL

## 2014-06-18 MED ORDER — SODIUM CHLORIDE 0.9 % IJ SOLN
INTRAMUSCULAR | Status: AC
  Filled 2014-06-18: qty 3

## 2014-06-18 MED ORDER — SIMETHICONE 80 MG PO CHEW
80.0000 mg | CHEWABLE_TABLET | ORAL | Status: DC
  Administered 2014-06-19 – 2014-06-21 (×3): 80 mg via ORAL
  Filled 2014-06-18 (×3): qty 1

## 2014-06-18 MED ORDER — NALOXONE HCL 1 MG/ML IJ SOLN: 1.0000 ug/kg/h | INTRAVENOUS | Status: DC | PRN

## 2014-06-18 MED ORDER — CITRIC ACID-SODIUM CITRATE 334-500 MG/5ML PO SOLN
ORAL | Status: AC
  Filled 2014-06-18: qty 15

## 2014-06-18 MED ORDER — HYDROMORPHONE HCL 1 MG/ML IJ SOLN
INTRAMUSCULAR | Status: AC
  Administered 2014-06-18: 0.5 mg
  Filled 2014-06-18: qty 1

## 2014-06-18 MED ORDER — FENTANYL CITRATE 0.05 MG/ML IJ SOLN
INTRAMUSCULAR | Status: DC | PRN
  Administered 2014-06-18: 50 ug via INTRAVENOUS
  Administered 2014-06-18: 250 ug via INTRAVENOUS
  Administered 2014-06-18: 50 ug via INTRAVENOUS

## 2014-06-18 MED ORDER — OXYCODONE-ACETAMINOPHEN 5-325 MG PO TABS: 1.0000 | ORAL_TABLET | ORAL | Status: DC | PRN

## 2014-06-18 MED ORDER — MIDAZOLAM HCL 2 MG/2ML IJ SOLN: INTRAMUSCULAR | Status: DC | PRN

## 2014-06-18 MED ORDER — IBUPROFEN 600 MG PO TABS
600.0000 mg | ORAL_TABLET | Freq: Four times a day (QID) | ORAL | Status: DC | PRN
Start: 2014-06-18 — End: 2014-06-18

## 2014-06-18 MED ORDER — LIDOCAINE HCL (CARDIAC) 20 MG/ML IV SOLN
INTRAVENOUS | Status: DC | PRN
  Administered 2014-06-18: 50 mg via INTRAVENOUS

## 2014-06-18 MED ORDER — ONDANSETRON HCL 4 MG/2ML IJ SOLN
INTRAMUSCULAR | Status: DC | PRN
  Administered 2014-06-18: 4 mg via INTRAVENOUS

## 2014-06-18 MED ORDER — LACTATED RINGERS IV SOLN
INTRAVENOUS | Status: DC | PRN
  Administered 2014-06-18 (×2): via INTRAVENOUS

## 2014-06-18 MED ORDER — PHENYLEPHRINE 40 MCG/ML (10ML) SYRINGE FOR IV PUSH (FOR BLOOD PRESSURE SUPPORT)
PREFILLED_SYRINGE | INTRAVENOUS | Status: AC
  Filled 2014-06-18: qty 20

## 2014-06-18 MED ORDER — TETANUS-DIPHTH-ACELL PERTUSSIS 5-2.5-18.5 LF-MCG/0.5 IM SUSP: 0.5000 mL | Freq: Once | INTRAMUSCULAR | Status: DC

## 2014-06-18 MED ORDER — SODIUM BICARBONATE 8.4 % IV SOLN
INTRAVENOUS | Status: DC | PRN
  Administered 2014-06-18 (×4): 5 mL via EPIDURAL

## 2014-06-18 MED ORDER — KETOROLAC TROMETHAMINE 30 MG/ML IJ SOLN
30.0000 mg | Freq: Four times a day (QID) | INTRAMUSCULAR | Status: AC | PRN
  Administered 2014-06-18: 30 mg via INTRAVENOUS
  Filled 2014-06-18: qty 1

## 2014-06-18 MED ORDER — SIMETHICONE 80 MG PO CHEW: 80.0000 mg | CHEWABLE_TABLET | ORAL | Status: DC | PRN

## 2014-06-18 MED ORDER — ONDANSETRON HCL 4 MG/2ML IJ SOLN
4.0000 mg | Freq: Once | INTRAMUSCULAR | Status: AC
  Administered 2014-06-18: 4 mg via INTRAVENOUS
  Filled 2014-06-18: qty 2

## 2014-06-18 MED ORDER — PRENATAL MULTIVITAMIN CH
1.0000 | ORAL_TABLET | Freq: Every day | ORAL | Status: DC
  Administered 2014-06-19 – 2014-06-21 (×3): 1 via ORAL
  Filled 2014-06-18 (×3): qty 1

## 2014-06-18 MED ORDER — MORPHINE SULFATE (PF) 0.5 MG/ML IJ SOLN
INTRAMUSCULAR | Status: DC | PRN
  Administered 2014-06-18: 1.5 mg via INTRAVENOUS
  Administered 2014-06-18: 3.5 mg via EPIDURAL

## 2014-06-18 MED ORDER — NALOXONE HCL 0.4 MG/ML IJ SOLN: 0.4000 mg | INTRAMUSCULAR | Status: DC | PRN

## 2014-06-18 MED ORDER — PROPOFOL 10 MG/ML IV BOLUS
INTRAVENOUS | Status: DC | PRN
  Administered 2014-06-18: 200 mg via INTRAVENOUS

## 2014-06-18 SURGICAL SUPPLY — 35 items
BENZOIN TINCTURE PRP APPL 2/3 (GAUZE/BANDAGES/DRESSINGS) ×3 IMPLANT
CLAMP CORD UMBIL (MISCELLANEOUS) IMPLANT
CLOSURE WOUND 1/2 X4 (GAUZE/BANDAGES/DRESSINGS) ×1
CLOTH BEACON ORANGE TIMEOUT ST (SAFETY) ×3 IMPLANT
CONTAINER PREFILL 10% NBF 15ML (MISCELLANEOUS) IMPLANT
DRAPE SHEET LG 3/4 BI-LAMINATE (DRAPES) IMPLANT
DRSG OPSITE POSTOP 4X10 (GAUZE/BANDAGES/DRESSINGS) ×3 IMPLANT
DRSG VASELINE 3X18 (GAUZE/BANDAGES/DRESSINGS) ×3 IMPLANT
DURAPREP 26ML APPLICATOR (WOUND CARE) ×3 IMPLANT
ELECT REM PT RETURN 9FT ADLT (ELECTROSURGICAL) ×3
ELECTRODE REM PT RTRN 9FT ADLT (ELECTROSURGICAL) ×1 IMPLANT
EXTRACTOR VACUUM KIWI (MISCELLANEOUS) IMPLANT
EXTRACTOR VACUUM M CUP 4 TUBE (SUCTIONS) IMPLANT
EXTRACTOR VACUUM M CUP 4' TUBE (SUCTIONS)
GAUZE SPONGE 4X4 12PLY STRL (GAUZE/BANDAGES/DRESSINGS) ×3 IMPLANT
GLOVE BIO SURGEON STRL SZ7.5 (GLOVE) ×3 IMPLANT
GOWN STRL REUS W/TWL LRG LVL3 (GOWN DISPOSABLE) ×6 IMPLANT
KIT ABG SYR 3ML LUER SLIP (SYRINGE) IMPLANT
NEEDLE HYPO 25X5/8 SAFETYGLIDE (NEEDLE) IMPLANT
NS IRRIG 1000ML POUR BTL (IV SOLUTION) ×3 IMPLANT
PACK C SECTION WH (CUSTOM PROCEDURE TRAY) ×3 IMPLANT
PAD ABD 8X7 1/2 STERILE (GAUZE/BANDAGES/DRESSINGS) ×3 IMPLANT
PAD OB MATERNITY 4.3X12.25 (PERSONAL CARE ITEMS) ×3 IMPLANT
RTRCTR C-SECT PINK 25CM LRG (MISCELLANEOUS) IMPLANT
STAPLER VISISTAT 35W (STAPLE) IMPLANT
STRIP CLOSURE SKIN 1/2X4 (GAUZE/BANDAGES/DRESSINGS) ×2 IMPLANT
SUT PLAIN 0 NONE (SUTURE) IMPLANT
SUT VIC AB 0 CT1 36 (SUTURE) ×24 IMPLANT
SUT VIC AB 3-0 CTX 36 (SUTURE) ×6 IMPLANT
SUT VIC AB 3-0 SH 27 (SUTURE)
SUT VIC AB 3-0 SH 27X BRD (SUTURE) IMPLANT
SUT VIC AB 4-0 KS 27 (SUTURE) ×3 IMPLANT
SUT VICRYL 0 TIES 12 18 (SUTURE) IMPLANT
TOWEL OR 17X24 6PK STRL BLUE (TOWEL DISPOSABLE) ×3 IMPLANT
TRAY FOLEY CATH 14FR (SET/KITS/TRAYS/PACK) ×3 IMPLANT

## 2014-06-18 NOTE — Transfer of Care (Signed)
Immediate Anesthesia Transfer of Care Note  Patient: Yolanda Christian  Procedure(s) Performed: Procedure(s): CESAREAN SECTION (N/A)  Patient Location: PACU  Anesthesia Type:General  Level of Consciousness: awake, alert  and oriented  Airway & Oxygen Therapy: Patient Spontanous Breathing and Patient connected to nasal cannula oxygen  Post-op Assessment: Report given to RN and Post -op Vital signs reviewed and stable  Post vital signs: Reviewed and stable  Last Vitals:  Filed Vitals:   06/18/14 1232  BP: 123/89  Pulse: 112  Temp:   Resp:     Complications: No apparent anesthesia complications

## 2014-06-18 NOTE — Lactation Note (Signed)
This note was copied from the chart of Berlyn Saylor. Lactation Consultation Note  Patient Name: Yolanda Christian [id number] Date: 06/18/2014 Reason for consult: Initial assessment Baby 8 hours of life. Baby girl "A" syringe and finger-fed 7 mls of EBM. Baby tolerated feeding well. [name] to feed with cues and at least every 3 hours. Plan is for mom to offer breast, supplement with EBM according to supplementation guidelines, and the post-pump for next feeding. [name] to offer lots of STS. FOB show how to wash pump parts and how to reconnect to assist mom with pumping. Demonstrated how to syringe and finger feed with FOBs as well. Discussed assessment, interventions, and plan for feeding with patient's RN, Hardin Negus, and enc mom to call out for assistance as needed.   Maternal Data    Feeding Feeding Type: Breast Milk Length of feed: 15 min  LATCH Score/Interventions Latch: Too sleepy or reluctant, no latch achieved, no sucking elicited. Intervention(s): Skin to skin                    Lactation Tools Discussed/Used Tools: Pump Breast pump type: Double-Electric Breast Pump Pump Review: Setup, frequency, and cleaning;Milk Storage Initiated by:: Aurea Graff, RN Date initiated:: 06/18/14 (1700)   Consult Status Consult Status: Follow-up Date: 06/19/14 Follow-up type: In-patient    [name] 06/18/2014, 10:04 PM

## 2014-06-18 NOTE — Progress Notes (Signed)
Patient ID: Yolanda Christian, female   DOB: 05-22-1980, 34 y.o.   MRN: [id number] Pt was checked at 11 AM and was thought to be 6 cm 90% effaced and the vertex was at - 1 station.The monitoring OF BOTH BABIES IS NORMAL

## 2014-06-18 NOTE — Progress Notes (Signed)
Patient ID: Yolanda Christian, female   DOB: 1980/06/02, 34 y.o.   MRN: [id number] The pt has gone into labor and has received an epidural. The cervix is 4 cm 80 % effaced and the vertex is at - 2 station. I have spoken to the pt again and advised her for the last of many times that I advise a c section She wants a vaginal delivery and I have advised her again that unless the second baby converts to vertex she will need a breech extraction or c section for the second twin

## 2014-06-18 NOTE — Anesthesia Procedure Notes (Addendum)
Epidural Patient location during procedure: OB Start time: 06/18/2014 9:40 AM  Staffing Anesthesiologist: Rudean Curt Performed by: anesthesiologist   Preanesthetic Checklist Completed: patient identified, site marked, surgical consent, pre-op evaluation, timeout performed, IV checked, risks and benefits discussed and monitors and equipment checked  Epidural Patient position: sitting Prep: site prepped and draped and DuraPrep Patient monitoring: continuous pulse ox and blood pressure Approach: midline Location: L3-L4 Injection technique: LOR air  Needle:  Needle type: Tuohy  Needle gauge: 17 G Needle length: 9 cm and 9 Needle insertion depth: 5 cm cm Catheter type: closed end flexible Catheter size: 19 Gauge Catheter at skin depth: 10 cm Test dose: negative  Assessment Events: blood not aspirated, injection not painful, no injection resistance, negative IV test and no paresthesia  Additional Notes Patient identified.  Risk benefits discussed including failed block, incomplete pain control, headache, nerve damage, paralysis, blood pressure changes, nausea, vomiting, reactions to medication both toxic or allergic, and postpartum back pain.  Patient expressed understanding and wished to proceed.  All questions were answered.  Sterile technique used throughout procedure and epidural site dressed with sterile barrier dressing. No paresthesia or other complications noted.The patient did not experience any signs of intravascular injection such as tinnitus or metallic taste in mouth nor signs of intrathecal spread such as rapid motor block. Please see nursing notes for vital signs.   Procedure Name: Intubation Date/Time: 06/18/2014 1:13 PM Performed by: Jonna Munro Pre-anesthesia Checklist: Patient identified, Emergency Drugs available, Suction available, Timeout performed and Patient being monitored Patient Re-evaluated:Patient Re-evaluated prior to inductionOxygen Delivery  Method: Circle system utilized Preoxygenation: Pre-oxygenation with 100% oxygen Intubation Type: IV induction, Rapid sequence and Cricoid Pressure applied Laryngoscope Size: Glidescope and 3 Grade View: Grade I Tube type: Oral Tube size: 7.0 mm Number of attempts: 1 Airway Equipment and Method: Video-laryngoscopy Placement Confirmation: ETT inserted through vocal cords under direct vision,  positive ETCO2 and breath sounds checked- equal and bilateral Secured at: 20 cm Tube secured with: Tape Dental Injury: Teeth and Oropharynx as per pre-operative assessment

## 2014-06-18 NOTE — Progress Notes (Signed)
Patient ID: Yolanda Christian, female   DOB: 30-Oct-1980, 34 y.o.   MRN: [id number] The RN checked the pt at 11: 50 AM and found the cervix to be 6-7 cm Contractions ate too close together for pitocin

## 2014-06-18 NOTE — MAU Note (Signed)
ROM at 0420, some contractions. Twins.

## 2014-06-18 NOTE — Progress Notes (Signed)
Patient ID: Yolanda Christian, female   DOB: Mar 08, 1981, 34 y.o.   MRN: [id number] The pt is more uncomfortable and exam shows cervix 9+ cm vertex at -1/-2 station

## 2014-06-18 NOTE — Anesthesia Preprocedure Evaluation (Signed)
Anesthesia Evaluation  Patient identified by MRN, date of birth, ID band Patient awake    Reviewed: Allergy & Precautions, H&P , Patient's Chart, lab work & pertinent test results  Airway Mallampati: II  TM Distance: >3 FB Neck ROM: full    Dental   Pulmonary  breath sounds clear to auscultation        Cardiovascular Rhythm:regular Rate:Normal     Neuro/Psych    GI/Hepatic   Endo/Other    Renal/GU      Musculoskeletal   Abdominal   Peds  Hematology   Anesthesia Other Findings twins  Reproductive/Obstetrics (+) Pregnancy                             Anesthesia Physical Anesthesia Plan  ASA: II  Anesthesia Plan: Epidural   Post-op Pain Management:    Induction:   Airway Management Planned:   Additional Equipment:   Intra-op Plan:   Post-operative Plan:   Informed Consent: I have reviewed the patients History and Physical, chart, labs and discussed the procedure including the risks, benefits and alternatives for the proposed anesthesia with the patient or authorized representative who has indicated his/her understanding and acceptance.     Plan Discussed with:   Anesthesia Plan Comments:         Anesthesia Quick Evaluation

## 2014-06-18 NOTE — H&P (Signed)
NAMEMarland Kitchen  NECHA, HARRIES NO.:  [id number]  MEDICAL RECORD NO.:  [id number]  LOCATION:  1914                          FACILITY:  [place]  PHYSICIAN:  Lucille Passy. Ulanda Edison, M.D. DATE OF BIRTH:  [birth year]  DATE OF ADMISSION:  06/18/2014 DATE OF DISCHARGE:                             HISTORY & PHYSICAL   HISTORY OF PRESENT ILLNESS:  This is a 34 year old white female para 2-0- 0-2, gravida 3, at 35 weeks and 4 days gestation with [facility] of July 19, 2014 with twin dichorionic diamniotic twins with premature rupture of the membranes at 4:30 a.m. on the day of admission.  Blood group and type O positive, negative antibody, RPR negative, urine culture negative.  Hepatitis B surface antigen negative, HIV negative, GC and Chlamydia negative.  Rubella equivocal, repeat nonimmune. Varicella not recorded.  Pap smear normal.  HPV negative.  One hour Glucola was 152 and 3 hour GTT 60, 171, 184 and 127, it was repeated a month later 62, 140, 158, and 97.  Repeat HIV and RPR negative.  The patient began her prenatal course early in pregnancy.  On January 15, 2014, had an ultrasound that showed the gestational age to be 13 weeks 4 days on twin A and 13 weeks 1 day on twin B, EDC of July 19, 2014, was given.  Her starting weight was 150, her ending weight was 196.  I informed her early in pregnancy that the babies were other than vertex vertex at delivery but I would recommend a C-section.  During pregnancy, the twin B showed a mild pyelectasis and the babies were vertex vertex in an ultrasound at about 30 weeks.  At 34 weeks, the twins were growing appropriately.  I also continued to have a discussion with the patient about the fact that I would prefer to deliver the babies by C-section if one of them was not vertex, now she has ruptured membranes and she is admitted.  PAST MEDICAL HISTORY:  She did have chickenpox as a child.  She had adenoid removed as a child.  ALLERGIES:  No known  drug allergies.  No latex allergy.  SOCIAL HISTORY:  She never smoked.  Does not drink.  Does not take illegal drugs.  She is an Therapist, sports at [employer].  FAMILY HISTORY:  Father has diabetes and high blood pressure.  Maternal grandfather, cancer of the lung.  PHYSICAL EXAMINATION:  VITAL SIGNS:  On admission, blood pressure 130/82, pulse 91. HEART:  Normal size and sounds.  No murmurs. LUNGS:  Clear to auscultation. ABDOMEN:  Soft. PELVIC:  Fundal height on June 17, 2014 was 43.5 cm.  Fetal heart tones of both babies are normal.  Cervix was examined in the office the day prior to admission and was a fingertip dilated, 20% effaced and vertex was at a -4 station.  ADMITTING IMPRESSION:  Intrauterine pregnancy, twin gestation, dichorionic diamniotic premature rupture of the membranes, premature gestation.  The patient is admitted.  She again has been counseled about our preference for doing a C-section.  She at this point wants to see if she will go into labor and the second baby would convert to vertex.  I am going to admit her to labor and delivery, and we will see now if her labor starts, and if not, consider Pitocin augmentation.     Lucille Passy. Ulanda Edison, M.D.     TFH/MEDQ  D:  06/18/2014  T:  06/18/2014  Job:  [id number]

## 2014-06-18 NOTE — Op Note (Signed)
NAME:  GERI, HEPLER NO.:  [id number]  MEDICAL RECORD NO.:  [id number]  LOCATION:  [facility]                          FACILITY:  [place]  PHYSICIAN:  Lucille Passy. Ulanda Edison, M.D. DATE OF BIRTH:  [birth year]  DATE OF PROCEDURE:  06/18/2014 DATE OF DISCHARGE:                              OPERATIVE REPORT   PREOPERATIVE DIAGNOSES:  Intrauterine pregnancy, twins, dichorionic diamniotic.  Baby A vertex, delivered.  Baby B, transverse with fetal bradycardia.  Nothing close to the vaginal opening presenting.  POSTOPERATIVE DIAGNOSES:  Intrauterine pregnancy, twins, dichorionic diamniotic. Baby A vertex, delivered. Baby B, transverse with fetal bradycardia. Nothing close to the vaginal opening presenting.  OPERATION:  Low-transverse cervical C-section.  OPERATOR:  Lucille Passy. Ulanda Edison, M.D.  Terrence DupontNehemiah Settle  ANESTHESIA:  General anesthesia.  DESCRIPTION OF PROCEDURE:  The patient was brought to the operating room and a vaginal delivery was accomplished of baby A.  At that point, baby B remained high in the abdomen.  Fetal bradycardia developed after which I tried to establish with the ultrasound where the vertex and breech were, neither was close to the lower part of the uterus, bradycardia was in the 70s, so immediately converted to a C-section.  General anesthesia was established after a few minutes delay as well as Betadine scrub.  I pinched the lower abdomen prior to giving general anesthesia.  The patient felt it very clearly, so she was given general anesthesia, intubated and the incision was made in a transverse fashion through the skin, subcutaneous tissue, and fascia.  Fascia was extended laterally, separated from the rectus muscle superiorly.  The rectus muscle was split in the midline.  Peritoneum was opened vertically.  It was stretched.  The lower Balfour retractor was used.  I made a short transverse incision through the superficial layers of the myometrium into  the amniotic sac, pulled superiorly and inferiorly, and there was nothing in the uterine incision to grasp.  Ultimately, the arm came down, so I pushed it back inside went inside the uterus, went high into the uterus, found the baby and pulled it out vertex.  Cord was clamped. The infant was given to Dr. Karmen Stabs.  Apgars were 4 and 7.  Apgars of baby A were 9 and 9.  The incision was grasped with ring forceps.  The placenta was removed and intact.  The inside of the uterus was palpated and was found to be free of any placenta.  I vigorously massaged the uterus to try to establish as much tone as I could with the uterus on the outside of the abdominal cavity.  The uterus seemed to begin to respond.  I then closed the uterus in 2 layers using a running locked suture of 0 Vicryl on the first layer, nonlocking suture of the same material on the second layer.  There was some hematoma formation just below the peritoneum at both angles, but nothing in the broad ligament. These did not expand.  I tried to blot the gutters as free of blood as I could.  I was satisfied that we had complete hemostasis, so I removed the retractor, all sponges, and closed the abdominal wall with  interrupted sutures of 0 Vicryl on the rectus muscle and peritoneum, 2 running sutures of 0 Vicryl on the fascia, running 3-0 Vicryl on the subcutaneous tissue, and 4-0 Vicryl on the skin.The tubes, ovaries and uterus were normal..  The patient seemed to tolerate the procedure well.  I checked for any vaginal lacerations. There were none.  I massaged the uterus vigorously, although still a little bit of clot formation present, but nothing significant and all the blood appeared dark.  At this time, the procedure was terminated. The patient will be returned to recovery once she recovers from the general anesthesia.  Blood loss was difficult to estimate.  We had 800 mL of irrigation.  We had an unknown amount of amniotic fluid.   Total fluid in the canister I think was 15-1700, so it is difficult to measure.  Vital signs appear stable.  We will watch the patient's blood count.  Her hemoglobin began at 9.5.     Lucille Passy. Ulanda Edison, M.D.     TFH/MEDQ  D:  06/18/2014  T:  06/18/2014  Job:  [id number]

## 2014-06-18 NOTE — Lactation Note (Signed)
This note was copied from the chart of Yolanda Christian. Lactation Consultation Note  Patient Name: Yolanda Christian KZLDJ'T Date: 06/18/2014 Reason for consult: Initial assessment [name] 8 hours of life. Mom nurse her first two children about a year and had a good supply. Mom has already been pumping and babies have each been supplemented with EBM. Attempted to latch [name] to mom's breast in football position. Mom is sleepy and baby is fussy and sleepy at breast. Baby swallowing hard and burping loudly. Attempted to assist FOB to syringe and finger-feed EBM but baby difficult to feed. Baby boy did take 29mls of EBM with syringe while mom started pumping. Mom is getting lots colostrum, increasing each time she pumps. 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 [name] to call [facility]. Also told her that I would inquire with Stana Bunting as AD for Lactation. Mom given [facility] brochure, aware of OP/BFSG, community resources and [facility] phone line assistance after D/C. [name] to put at least one baby to breast with each feeding through the night as she is able, and to offer STS as she can as well. [name] to feed with cues and at least every 3 hours. Reviewed LPI behavior and care measures. Discussed assessment, interventions, and plan with patient's RN, Hardin Negus.  Maternal Data    Feeding Feeding Type: Breast Milk Length of feed: 30 min  LATCH Score/Interventions Latch: Too sleepy or reluctant, no latch achieved, no sucking elicited. Intervention(s): Skin to skin                    Lactation Tools Discussed/Used Tools: Pump Breast pump type: Double-Electric Breast Pump Pump Review: Setup, frequency, and cleaning;Milk Storage Initiated by:: Aurea Graff, Rn Date initiated:: 06/18/14 (1700)   Consult Status Consult Status: Follow-up Date: 06/19/14 Follow-up type:  In-patient    [name] 06/18/2014, 9:47 PM

## 2014-06-18 NOTE — Anesthesia Postprocedure Evaluation (Signed)
  Anesthesia Post-op Note  Patient: Yolanda Christian  Procedure(s) Performed: Procedure(s): CESAREAN SECTION (N/A)  Patient Location: PACU  Anesthesia Type:General  Level of Consciousness: awake, alert  and oriented  Airway and Oxygen Therapy: Patient Spontanous Breathing  Post-op Pain: mild  Post-op Assessment: Post-op Vital signs reviewed, Patient's Cardiovascular Status Stable, Respiratory Function Stable, Patent Airway, No signs of Nausea or vomiting, Pain level controlled, No headache, No backache, No residual numbness and No residual motor weakness  Post-op Vital Signs: Reviewed and stable  Complications: No apparent anesthesia complications

## 2014-06-18 NOTE — MAU Provider Note (Signed)
History     CSN: [id number]  Arrival date and time: 06/18/14 0530   First Provider Initiated Contact with Patient 06/18/14 [phone]      Chief Complaint  Patient presents with  . Rupture of Membranes   HPI  Ms. Yolanda Christian is a 34 y.o. G3P2002 at [redacted]w[redacted]d with twin gestation who presents to MAU today with complaint of SROM at 25 today. She states she woke up feeling wet and has had continued LOF since then. She denies vaginal bleeding. She states occasional mild contractions rated at 3-4/10. She denies other complications with the pregnancy. She was checked yesterday in the office and was 1 cm dilated. She states that baby A was vertex and baby B was breech at last [place]. She reports normal fetal movement.   OB History    Gravida Para Term Preterm AB TAB SAB Ectopic Multiple Living   3 2 2       2       Past Medical History  Diagnosis Date  . Abnormal Pap smear   . Hemorrhoids   . Coccyx disorder     positional tender on/off  . Normal pregnancy, repeat 02/16/2012    Past Surgical History  Procedure Laterality Date  . Colposcopy    . Adenoidectomy      Family History  Problem Relation Age of Onset  . Hypertension Father   . Diabetes Father   . Cancer Maternal Grandfather     lung    History  Substance Use Topics  . Smoking status: Never Smoker   . Smokeless tobacco: Never Used  . Alcohol Use: No    Allergies: No Known Allergies  Prescriptions prior to admission  Medication Sig Dispense Refill Last Dose  . Prenatal Vit-Fe Fumarate-FA (PRENATAL MULTIVITAMIN) TABS Take 1 tablet by mouth daily.   Past Week at Unknown time  . cyclobenzaprine (FLEXERIL) 10 MG tablet Take 1 at bedtime 30 tablet 0   . diclofenac (VOLTAREN) 75 MG EC tablet Take 1 tablet (75 mg total) by mouth 2 (two) times daily. 30 tablet 1   . ibuprofen (ADVIL,MOTRIN) 600 MG tablet Take 1 tablet (600 mg total) by mouth every 6 (six) hours. 15 tablet 0 Unknown at Unknown time  . oxyCODONE-acetaminophen  (PERCOCET/ROXICET) 5-325 MG per tablet Take 1 tablet by mouth every 6 (six) hours as needed. 15 tablet 0 Unknown at Unknown time  . predniSONE (DELTASONE) 20 MG tablet Take 1 tablet (20 mg total) by mouth 2 (two) times daily. 10 tablet 0   . pseudoephedrine (SUDAFED) 30 MG tablet Take 30 mg by mouth daily as needed. For congestion   Unknown at Unknown time  . triamcinolone cream (KENALOG) 0.1 % Apply 1 application topically 3 (three) times daily. 85.2 g 3     Review of Systems  Constitutional: Negative for malaise/fatigue.  Gastrointestinal: Positive for abdominal pain.  Genitourinary:       + LOF Neg - vaginal bleeding   Physical Exam   Blood pressure 130/82, pulse 91, last menstrual period 08/25/2013.  Physical Exam  Constitutional: She is oriented to person, place, and time. She appears well-developed and well-nourished. No distress.  HENT:  Head: Normocephalic.  Cardiovascular: Normal rate.   Respiratory: Effort normal.  GI: Soft. She exhibits no distension and no mass. There is no tenderness. There is no rebound and no guarding.  Genitourinary:  Grossly ruptured  Neurological: She is alert and oriented to person, place, and time.  Skin: Skin is warm and  dry. No erythema.  Psychiatric: She has a normal mood and affect.    Fetal Monitoring: Baseline A :  120 bpm, moderate variability, + accelerations, no decelerations Baseline B: 130 bpm, moderate variability, + accelerations, no decelerations Contractions: occasional, irregular  MAU Course  Procedures None  MDM Fern - positive Discussed patient with Dr. Willis Modena. Advised bedside US to confirm presentation prior to admission to determine SVD vs C/S Preliminary report shows Baby A is cephalic and Baby B is transverse with head to maternal left. Discussed results with Dr. Willis Modena. He will give report to Dr. Ulanda Edison who will come see the patient in MAU to discuss options for delivery.   Assessment and Plan  A: Twin IUP  at [redacted]w[redacted]d SROM  P: Admit patient to L&D  Luvenia Redden, PA-C  06/18/2014, 7:36 AM

## 2014-06-19 ENCOUNTER — Encounter (HOSPITAL_COMMUNITY): Payer: Self-pay | Admitting: Obstetrics and Gynecology

## 2014-06-19 LAB — CBC
HCT: 22.9 % — ABNORMAL LOW (ref 36.0–46.0)
Hemoglobin: 7.3 g/dL — ABNORMAL LOW (ref 12.0–15.0)
MCH: 26.8 pg (ref 26.0–34.0)
MCHC: 31.9 g/dL (ref 30.0–36.0)
MCV: 84.2 fL (ref 78.0–100.0)
PLATELETS: 116 10*3/uL — AB (ref 150–400)
RBC: 2.72 MIL/uL — AB (ref 3.87–5.11)
RDW: 14.7 % (ref 11.5–15.5)
WBC: 9.6 10*3/uL (ref 4.0–10.5)

## 2014-06-19 NOTE — Progress Notes (Signed)
Patient ID: Yolanda Christian, female   DOB: [birth year], 34 y.o.   MRN: [id number] #[id number] afebrile BP normal output excellent Abdomen soft and not tender. HGB 9.5 to 7.3

## 2014-06-19 NOTE — Lactation Note (Signed)
This note was copied from the chart of [place]. Lactation Consultation Note  Patient Name: Yolanda Christian VQQVZ'D Date: 06/19/2014 Reason for consult: Follow-up assessment;Late preterm infant;Infant < 6lbs Twins, LPIs, 20 hours of life. Mom just finished feeding [name] when [place] entered room. Mom states he is still not latching at breast, but is improving with syringe/finger feeding, and his suck-swallow-breath ability is improving. Both babies are taking 109mls by finger-feed at this time. Reviewed LPI/supplementation guidelines sheet with mom and enc increasing gradually to 20 mls for the next 24 hours. Mom is getting enough EBM with pumping in order to supplement both babies. Brought mom bottles and nipples in order to bottle-feed increasing EBM amounts since babies are not progressing with suckling at breast. [name] to continue to put babies to breast first, then supplement. Mom given one month rental papers for DEBP, and discussed that [facility] will either reimburse for rental, or provide a personal pump. Mom states she may pay out of pocket for a one month rental, and then take her healthy pregnancy pump for use after. [name] to call for assistance as needed.   Maternal Data    Feeding Feeding Type: Breast Fed (nuzzled, did not latch.) Length of feed: 15 min  LATCH Score/Interventions Latch: Too sleepy or reluctant, no latch achieved, no sucking elicited. Intervention(s): Skin to skin                    Lactation Tools Discussed/Used     Consult Status Consult Status: Follow-up Date: 06/20/14 Follow-up type: In-patient    [name] 06/19/2014, 10:12 AM

## 2014-06-19 NOTE — Lactation Note (Signed)
This note was copied from the chart of [name]. Lactation Consultation Note  Patient Name: Yolanda Christian GUYQI'H Date: 06/19/2014 Reason for consult: Follow-up assessment;Late preterm infant;Infant < 6lbs Twins, LPI, babies 20 hours of life. Mom reports Baby Girl "A" doing well with suck-swallow-breath while being finger/syringe fed, but is not suckling at breast. Discussed increasing supplementation amounts of EBM and giving with nipple and bottle. [name] to continue to put baby to breast first, then supplement with EBM according to supplementation guidelines. Mom states that she is comfortable with feeding of both babies, and FOB assisting. [name] to call for assistance as needed, and to continue to nurse with cues and at least every 3 hours for both babies.  Maternal Data    Feeding Feeding Type: Breast Fed (Attempted to latch, did not. ) Length of feed: 10 min  LATCH Score/Interventions Latch: Too sleepy or reluctant, no latch achieved, no sucking elicited. Intervention(s): Skin to skin                    Lactation Tools Discussed/Used     Consult Status Consult Status: Follow-up Date: 06/20/14 Follow-up type: In-patient    [name] 06/19/2014, 10:21 AM

## 2014-06-20 MED ORDER — MEASLES, MUMPS & RUBELLA VAC ~~LOC~~ INJ
0.5000 mL | INJECTION | Freq: Once | SUBCUTANEOUS | Status: AC
  Administered 2014-06-21: 0.5 mL via SUBCUTANEOUS
  Filled 2014-06-20: qty 0.5

## 2014-06-20 NOTE — Progress Notes (Signed)
Pt reports a collection of lumps in the armpit of her right arm.  She denies pain.  Pt reports that the lumps have been present since November 2015.  Pt reports that she had the lumps during her previous pregnancy and that they went away by 6 weeks post partum.  Pt states she has not yet told the MD.  Will continue to monitor.  Instructed her to tell her MD and I will pass along in report.

## 2014-06-20 NOTE — Progress Notes (Signed)
Patient ID: Yolanda Christian, female   DOB: 08/27/1980, 34 y.o.   MRN: [id number] #[id number] afebrile BP normal ambulating well, tolerating a diet, passing flatus and voiding well.

## 2014-06-21 MED ORDER — IBUPROFEN 600 MG PO TABS: 600.0000 mg | ORAL_TABLET | Freq: Four times a day (QID) | ORAL | Status: AC

## 2014-06-21 MED ORDER — OXYCODONE-ACETAMINOPHEN 5-325 MG PO TABS: 1.0000 | ORAL_TABLET | ORAL | Status: DC | PRN

## 2014-06-21 NOTE — Discharge Summary (Signed)
Obstetric Discharge Summary Reason for Admission: rupture of membranes Prenatal Procedures: NST and ultrasound Intrapartum Procedures: spontaneous vaginal delivery and cesarean: low cervical, transverse Postpartum Procedures: none Complications-Operative and Postpartum: Twin B transverse and bradycardia HEMOGLOBIN  Date Value Ref Range Status  06/19/2014 7.3* 12.0 - 15.0 g/dL Final    Comment:    REPEATED TO VERIFY DELTA CHECK NOTED    HCT  Date Value Ref Range Status  06/19/2014 22.9* 36.0 - 46.0 % Final    Physical Exam:  General: alert Lochia: appropriate Uterine Fundus: firm Incision: healing well   Discharge Diagnoses: Twin pregnancy, PPROM, vtx/transverse  Discharge Information: Date: 06/21/2014 Activity: pelvic rest and no strenuous activity Diet: routine Medications: Ibuprofen and Percocet Condition: stable Instructions: refer to practice specific booklet Discharge to: home Follow-up Information    Follow up with Melina Schools, MD. Schedule an appointment as soon as possible for a visit in 2 weeks.   Specialty:  Obstetrics and Gynecology   Why:  incision check   Contact information:   [address] [place] [address] [phone]       Newborn Data:   Efrata, Brunner [[id number]]  Live born female  Birth Weight: 5 lb 3.1 oz (2356 g) APGAR: 9, [address]   Nilam, [place] [[id number]]  Live born female  Birth Weight: 5 lb 7.7 oz (2485 g) APGAR: 4, 8  Home with mother.  Agastya Meister D 06/21/2014, 10:08 AM

## 2014-06-21 NOTE — Lactation Note (Signed)
This note was copied from the chart of [place]. Lactation Consultation Note  Patient Name: Yolanda Christian CLEXN'T Date: 06/21/2014   Visited with parents, babies at 10 hrs old.  Waiting on bilirubin checks.  Babies both nuzzling at the breast, and Mom and Dad feeding by bottle expressed breast milk (milk in).  Encouraged to try to increase by 5 ml each day.  Currently up to 20 ml per feeding and tolerating well.  Mom to obtain pump through [facility] today.  Recommended she call for follow up OP appt when babies both are gaining weight and making a better effort at the breast.  Talked about effects of newborn jaundice on breast feeding along with late preterm gestation. Encouraged continued skin to skin, and feeding every 3 hrs.  Mom feels confident (yet tired) with plan of feeding at present.  To follow up as OP in a week or so.  To call from home for appointment with Lactation Consultant.    Broadus John 06/21/2014, 10:12 AM

## 2014-06-21 NOTE — Progress Notes (Signed)
POD #3 Doing well, babies doing well, ready to go home Afeb, VSS Abd- soft, incision intact D/c home

## 2014-06-21 NOTE — Discharge Instructions (Signed)
As per discharge pamphlet °

## 2014-06-22 LAB — TYPE AND SCREEN
ABO/RH(D): O POS
Antibody Screen: NEGATIVE
Unit division: 0
Unit division: 0

## 2014-10-16 ENCOUNTER — Encounter (HOSPITAL_COMMUNITY): Payer: Self-pay | Admitting: Family Medicine

## 2014-10-16 ENCOUNTER — Emergency Department (HOSPITAL_COMMUNITY)
Admission: EM | Admit: 2014-10-16 | Discharge: 2014-10-16 | Disposition: A | Discharge status: Home / Self Care | Payer: 59 | Source: Home / Self Care | Attending: Family Medicine | Admitting: Family Medicine

## 2014-10-16 DIAGNOSIS — N61 Inflammatory disorders of breast: Secondary | ICD-10-CM | POA: Diagnosis not present

## 2014-10-16 MED ORDER — CEPHALEXIN 500 MG PO CAPS: 500.0000 mg | ORAL_CAPSULE | Freq: Three times a day (TID) | ORAL | Status: DC

## 2014-10-16 NOTE — ED Notes (Signed)
Pt c/o feeling "sick" onset today Reports right breast tenderness, fevers, and BA Alert, no signs of acute distress.

## 2014-10-16 NOTE — Discharge Instructions (Signed)
You develop mastitis. Please take the antibiotic as prescribed. If these give you an upset stomach or diarrhea you can take a probiotic or daily yogurt with live active cultures. This may cause some change in the taste of your milk but it is still safe to breast-feed her baby's.

## 2014-10-16 NOTE — ED Provider Notes (Signed)
CSN: [id number]     Arrival date & time 10/16/14  1919 History   First MD Initiated Contact with Patient 10/16/14 1953     Chief Complaint  Patient presents with  . Breast Problem   (Consider location/radiation/quality/duration/timing/severity/associated sxs/prior Treatment) HPI Patient is a lactating mother with 2 twins. Babies or 4 months old. Patient has had mastitis in the past and states that this pain is very similar. Pain started this morning. Patient is noticed a red spot on the medial aspect of her breast with proximal tracking. Associated with fevers, chills, joint pains. Denies any bloody or purulent discharge from the breast/nipple. Problem is constant and getting worse. Denies any clogged ducts.         Past Medical History  Diagnosis Date  . Abnormal Pap smear   . Hemorrhoids   . Coccyx disorder     positional tender on/off  . Normal pregnancy, repeat 02/16/2012   Past Surgical History  Procedure Laterality Date  . Colposcopy    . Adenoidectomy    . Cesarean section N/A 06/18/2014    Procedure: CESAREAN SECTION;  Surgeon: Newton Pigg, MD;  Location: [facility];  Service: Obstetrics;  Laterality: N/A;   Family History  Problem Relation Age of Onset  . Hypertension Father   . Diabetes Father   . Cancer Maternal Grandfather     lung   History  Substance Use Topics  . Smoking status: Never Smoker   . Smokeless tobacco: Never Used  . Alcohol Use: No   OB History    Gravida Para Term Preterm AB TAB SAB Ectopic Multiple Living   3 3 2 1     1 4      Review of Systems Per HPI with all other pertinent systems negative.   Allergies  Review of patient's allergies indicates no known allergies.  Home Medications   Prior to Admission medications   Medication Sig Start Date End Date Taking? Authorizing Provider  cephALEXin (KEFLEX) 500 MG capsule Take 1 capsule (500 mg total) by mouth 3 (three) times daily. 10/16/14   Waldemar Dickens, MD  ibuprofen  (ADVIL,MOTRIN) 600 MG tablet Take 1 tablet (600 mg total) by mouth every 6 (six) hours. 06/21/14   Cheri Fowler, MD  oxyCODONE-acetaminophen (PERCOCET/ROXICET) 5-325 MG per tablet Take 1-2 tablets by mouth every 4 (four) hours as needed (for pain scale 4-7). 06/21/14   Cheri Fowler, MD  Prenatal Vit-Fe Fumarate-FA (PRENATAL MULTIVITAMIN) TABS Take 1 tablet by mouth daily.    Historical Provider, MD   BP 120/77 mmHg  Pulse 95  Temp(Src) 98.7 F (37.1 C) (Oral)  Resp 16  SpO2 100%  Breastfeeding? Yes Physical Exam Physical Exam  Constitutional: oriented to person, place, and time. appears well-developed and well-nourished. No distress.  HENT:  Head: Normocephalic and atraumatic.  Eyes: EOMI. PERRL.  Neck: Normal range of motion.  Cardiovascular: RRR, no m/r/g, 2+ distal pulses,  Pulmonary/Chest: Effort normal and breath sounds normal. No respiratory distress.  Abdominal: Soft. Bowel sounds are normal. NonTTP, no distension.  Musculoskeletal: Normal range of motion. Non ttp, no effusion.  Neurological: alert and oriented to person, place, and time.  Skin: Right breast with tenderness and erythema at the 3:00 position with proximal tracking. Exam under went with female chaperone. Psychiatric: normal mood and affect. behavior is normal. Judgment and thought content normal.   ED Course  Procedures (including critical care time) Labs Review Labs Reviewed - No data to display  Imaging Review No results found.  MDM   1. Mastitis    Keflex    Waldemar Dickens, MD 10/16/14 2012

## 2015-05-14 ENCOUNTER — Ambulatory Visit (INDEPENDENT_AMBULATORY_CARE_PROVIDER_SITE_OTHER): Payer: 59 | Admitting: Family Medicine

## 2015-05-14 VITALS — BP 102/62 | HR 86 | Temp 97.8°F | Resp 18 | Ht 67.5 in | Wt 160.2 lb

## 2015-05-14 DIAGNOSIS — B349 Viral infection, unspecified: Secondary | ICD-10-CM | POA: Diagnosis not present

## 2015-05-14 DIAGNOSIS — R509 Fever, unspecified: Secondary | ICD-10-CM | POA: Diagnosis not present

## 2015-05-14 LAB — POCT INFLUENZA A/B
INFLUENZA A, POC: NEGATIVE
Influenza B, POC: NEGATIVE

## 2015-05-14 NOTE — Patient Instructions (Signed)
Great to meet you!  Come back if you have any concerns  Please seek help if you develop neck pain, chest pain, or persistent shortness of breath.   This does sound like flu, practice good hand washing and get your kids checked out if they develop signs   Viral Infections A virus is a type of germ. Viruses can cause:  Minor sore throats.  Aches and pains.  Headaches.  Runny nose.  Rashes.  Watery eyes.  Tiredness.  Coughs.  Loss of appetite.  Feeling sick to your stomach (nausea).  Throwing up (vomiting).  Watery poop (diarrhea). HOME CARE   Only take medicines as told by your doctor.  Drink enough water and fluids to keep your pee (urine) clear or pale yellow. Sports drinks are a good choice.  Get plenty of rest and eat healthy. Soups and broths with crackers or rice are fine. GET HELP RIGHT AWAY IF:   You have a very bad headache.  You have shortness of breath.  You have chest pain or neck pain.  You have an unusual rash.  You cannot stop throwing up.  You have watery poop that does not stop.  You cannot keep fluids down.  You or your child has a temperature by mouth above 102 F (38.9 C), not controlled by medicine.  Your baby is older than 3 months with a rectal temperature of 102 F (38.9 C) or higher.  Your baby is 6 months old or younger with a rectal temperature of 100.4 F (38 C) or higher. MAKE SURE YOU:   Understand these instructions.  Will watch this condition.  Will get help right away if you are not doing well or get worse.   This information is not intended to replace advice given to you by your health care provider. Make sure you discuss any questions you have with your health care provider.   Document Released: 02/26/2008 Document Revised: 06/07/2011 Document Reviewed: 08/21/2014 Elsevier Interactive Patient Education [employer].

## 2015-05-14 NOTE — Progress Notes (Signed)
   HPI  Patient presents today here with acute illness  She works as a Print production planner at [employer]  She has 24-36 hours of fever as high as 103, it is persistent and returns when her tylenol/motrin wear off  She has body aches, malaise, and cough as well  No Dyspnea, food/fluid intolerance, no neck pain or HA  PMH: Smoking status noted  Her past medical, surgical, and social Hx reviewed and updated in  EMR ROS: Per HPI, otherwise negative  Objective: BP 102/62 mmHg  Pulse 86  Temp(Src) 97.8 F (36.6 C) (Oral)  Resp 18  Ht 5' 7.5" (1.715 m)  Wt 160 lb 3.2 oz (72.666 kg)  BMI 24.71 kg/m2  SpO2 98%  LMP 04/26/2015 Gen: NAD, alert, cooperative with exam HEENT: NCAT, MMM, Mucus in nares, TMs WNL BL, oropharynx clear CV: RRR, good S1/S2, no murmur Resp: CTABL, no wheezes, non-labored Ext: No edema, warm Neuro: Alert and oriented, No gross deficits  Assessment and plan:  # Viral illness No signs of CAP, Breathing easily on exam Rapid flu neg but this is clinically flu with body aches and fever in season No tamiflu with lactating and negative result RTC for worsening or new symps    Orders Placed This Encounter  Procedures  . POCT Influenza A/B     Laroy Apple, MD [facility] 05/14/2015, 10:26 AM

## 2015-07-23 IMAGING — US US OB LIMITED
1 series · 13 of 17 positions shown · non-contrast
Comparison: none

[Series 1: us ob limited · 0.22mm/px · 17 acquisitions, 13 frames shown]
[im 1/17]
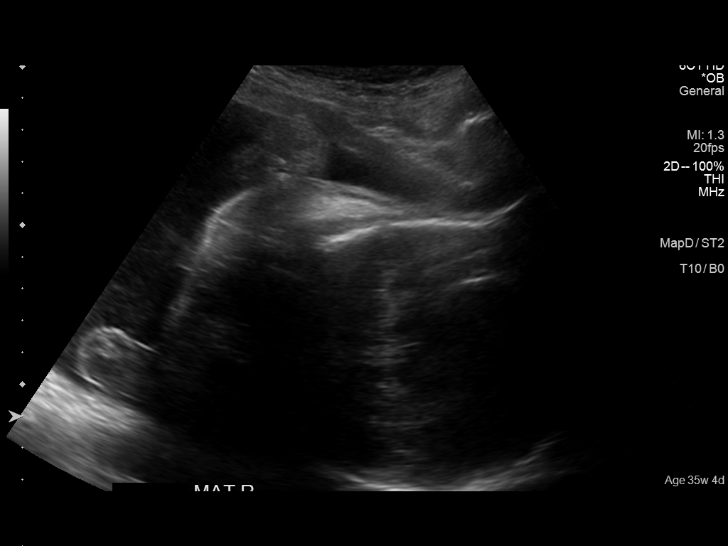
[im 2/17]
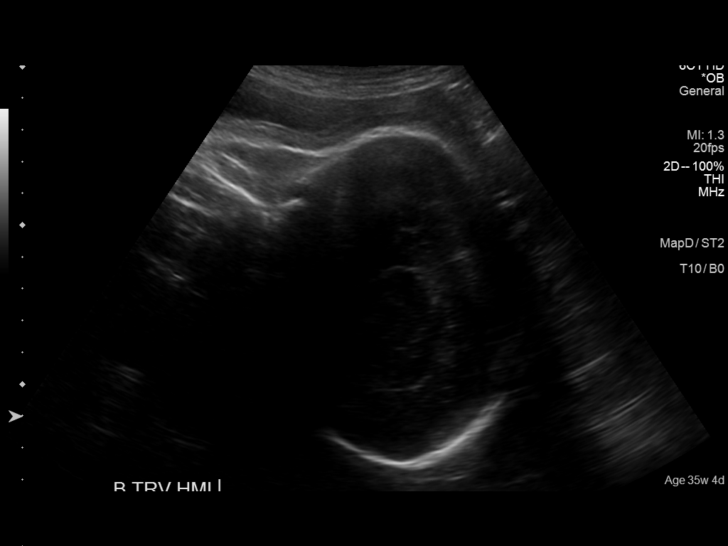
[im 4/17]
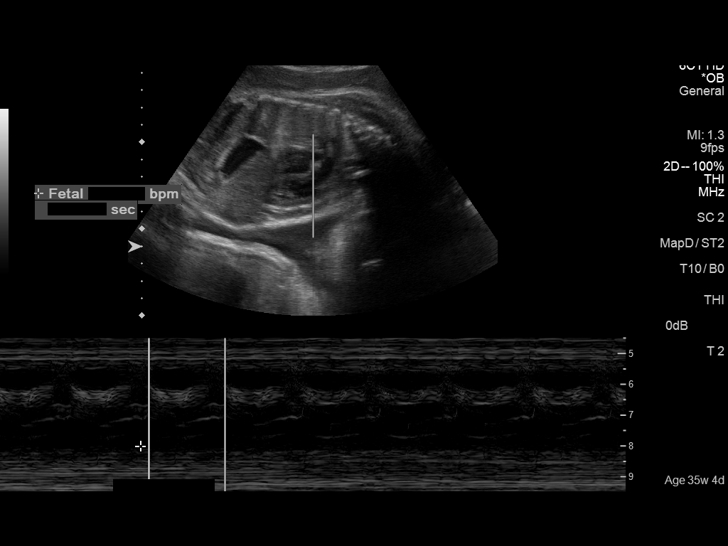
[im 5/17]
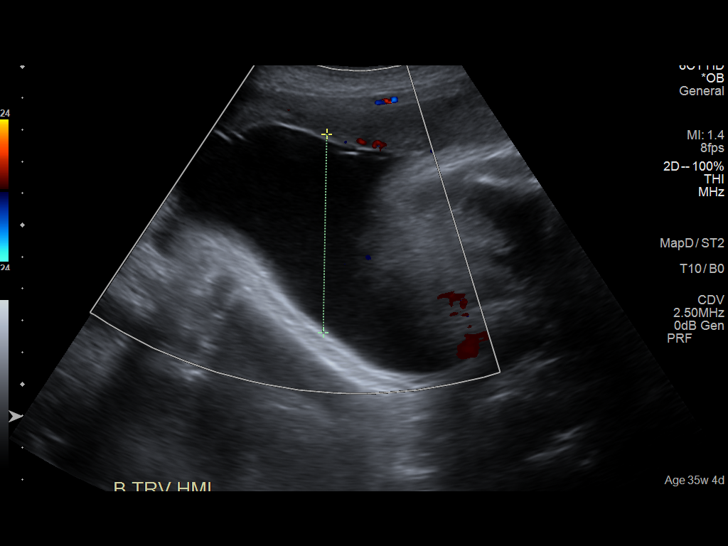
[im 6/17]
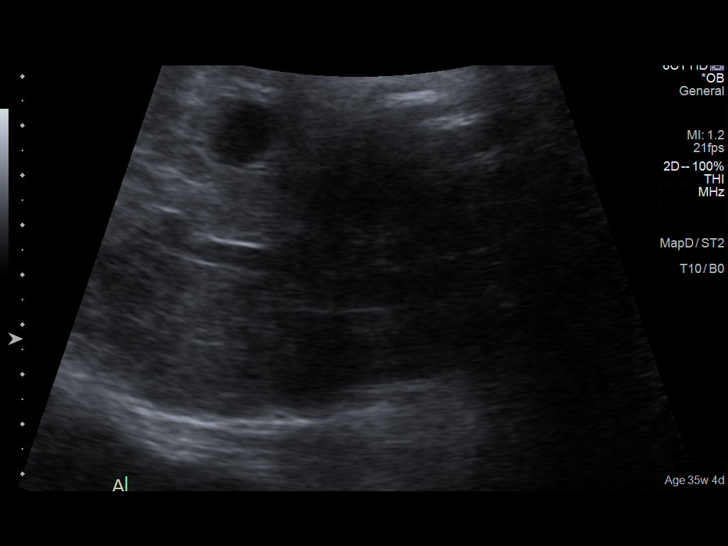
[im 8/17]
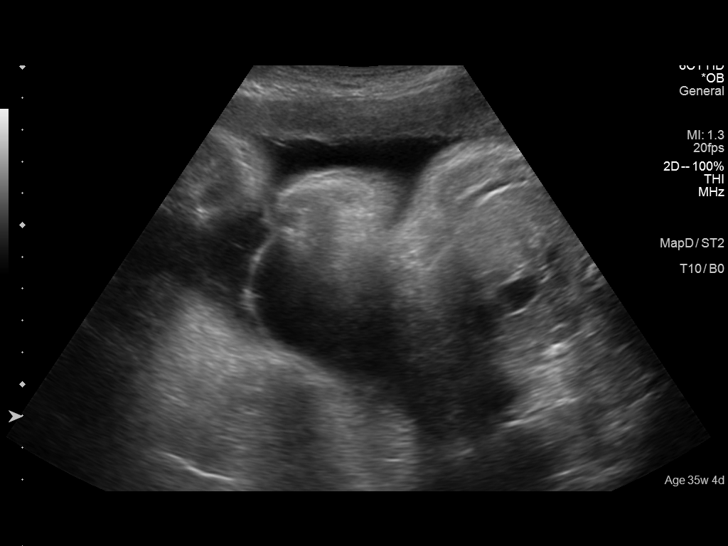
[im 9/17]
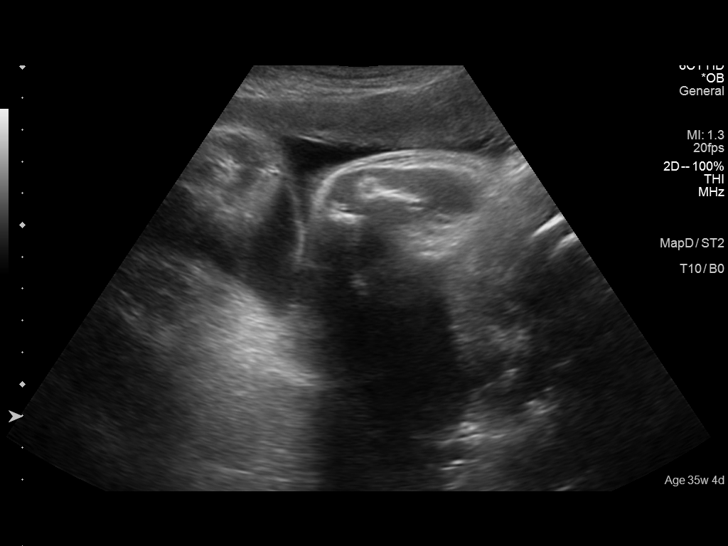
[im 10/17]
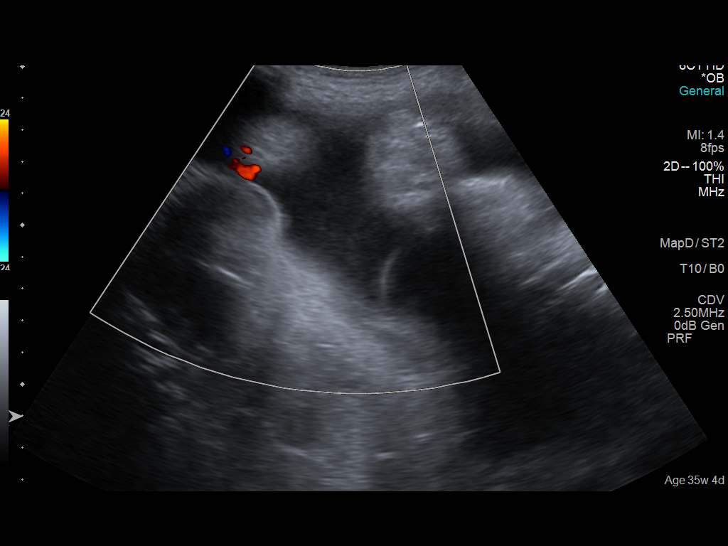
[im 12/17]
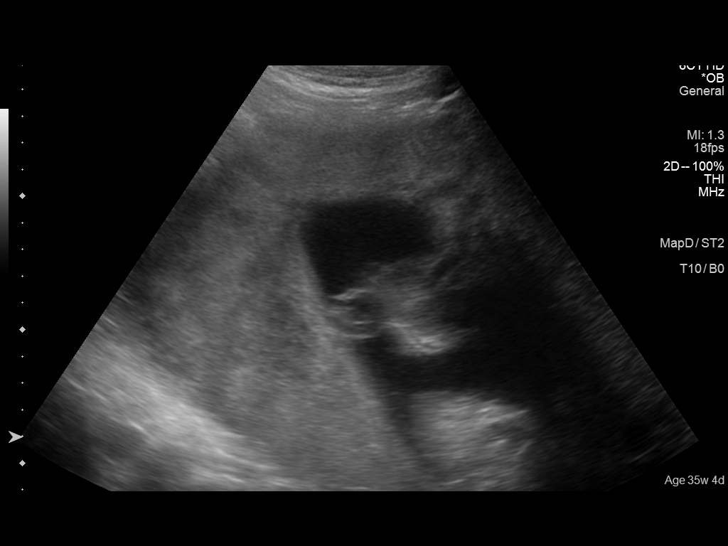
[im 13/17]
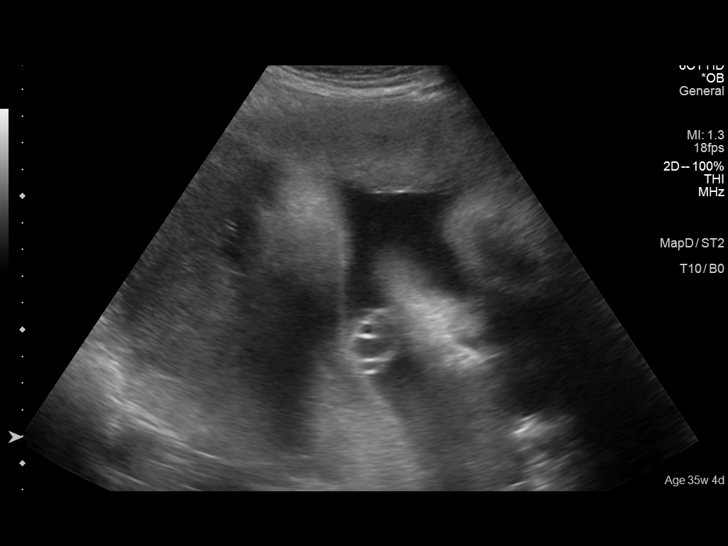
[im 14/17]
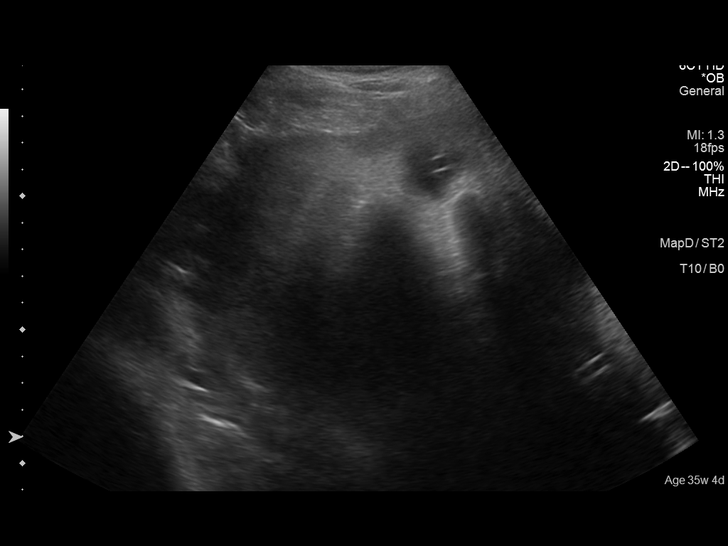
[im 16/17]
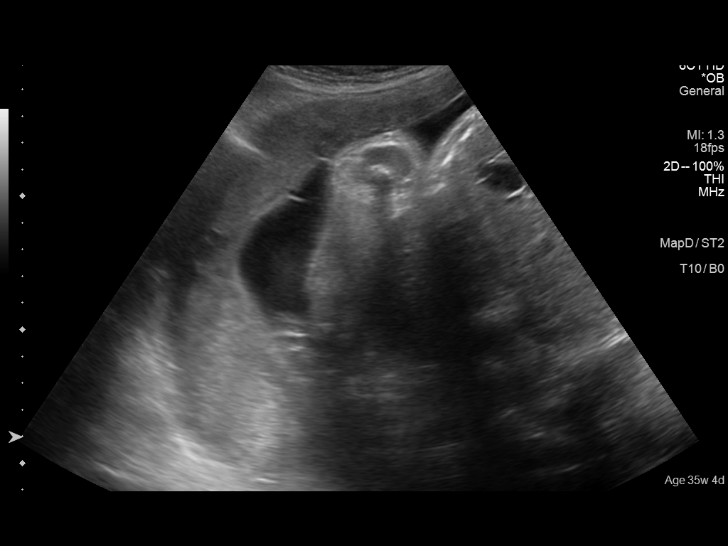
[im 17/17]
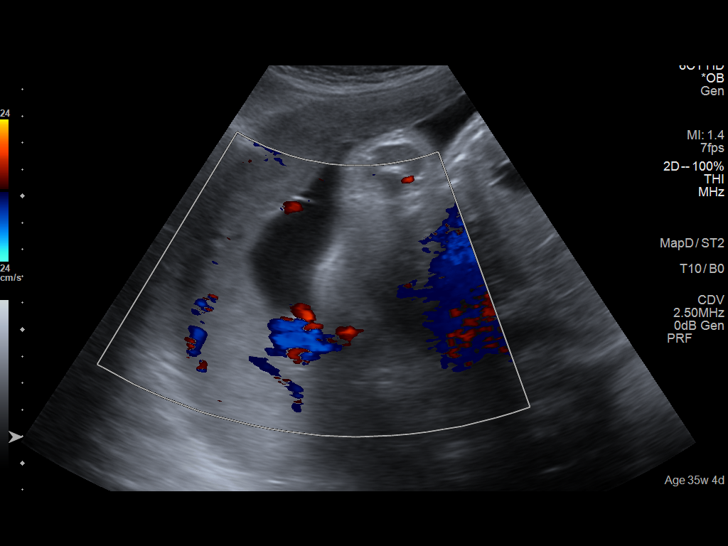

[13 of 17 positions shown; findings below may reference images not displayed]

OBSTETRICS REPORT
                      (Signed Final 06/18/2014 [DATE])

Service(s) Provided

 [HOSPITAL]                                         76815.0
Indications

 35 weeks gestation of pregnancy
 Twin pregnancy, di/di, third trimester
 Breech presentation
Fetal Evaluation (Fetus A)

 Num Of Fetuses:    2
 Fetal Heart Rate:  127                          bpm
 Cardiac Activity:  Observed
 Fetal Lie:         Maternal right side
 Presentation:      Cephalic
 Placenta:          Posterior, above cervical
                    os
 P. Cord            Visualized
 Insertion:

 Membrane Desc:     Dividing
                    Membrane seen

 Amniotic Fluid
 AFI FV:      Subjectively within normal limits
                                             Larg Pckt:     4.3  cm
Gestational Age (Fetus A)

 LMP:           42w 3d        Date:  08/25/13                 EDD:   06/01/14
 Clinical EDD:  35w 4d                                        EDD:   07/19/14
 Best:          35w 4d     Det. By:  Clinical EDD             EDD:   07/19/14

Fetal Evaluation (Fetus B)

 Num Of Fetuses:    2
 Fetal Heart Rate:  135                          bpm
 Cardiac Activity:  Observed
 Fetal Lie:         Upper Fetus
 Presentation:      Transverse, head to
                    maternal left
 Placenta:          Posterior, above cervical
                    os
 P. Cord            Visualized
 Insertion:

 Membrane Desc:     Dividing
                    Membrane seen

 Amniotic Fluid
 AFI FV:      Subjectively within normal limits
                                             Larg Pckt:     6.2  cm
Gestational Age (Fetus B)

 LMP:           42w 3d        Date:  08/25/13                 EDD:   06/01/14
 Clinical EDD:  35w 4d                                        EDD:   07/19/14
 Best:          35w 4d     Det. By:  Clinical EDD             EDD:   07/19/14
Cervix Uterus Adnexa

 Cervix:       Not visualized (advanced GA >40wks)
Impression

 DC/DA twins at 35w 4d
 Limited ultrasound performed to determine presentation

 Twin A (presenting twin):
 Cephalic, maternal right
 Posterior placenta
 Normal amniotic fluid volume

 Twin B:
 Transverse lie; fetal head to maternal left
 Posterior placenta
 Normal amniotic fluid volume
Recommendations

 Follow-up ultrasounds as clinically indicated.

 questions or concerns.

## 2015-10-16 DIAGNOSIS — H5213 Myopia, bilateral: Secondary | ICD-10-CM | POA: Diagnosis not present

## 2016-05-03 ENCOUNTER — Ambulatory Visit (INDEPENDENT_AMBULATORY_CARE_PROVIDER_SITE_OTHER): Payer: 59 | Admitting: Physician Assistant

## 2016-05-03 ENCOUNTER — Ambulatory Visit: Payer: 59

## 2016-05-03 VITALS — BP 118/76 | HR 85 | Temp 98.2°F | Resp 16 | Ht 68.0 in | Wt 147.0 lb

## 2016-05-03 DIAGNOSIS — A09 Infectious gastroenteritis and colitis, unspecified: Secondary | ICD-10-CM

## 2016-05-03 DIAGNOSIS — Z7184 Encounter for health counseling related to travel: Secondary | ICD-10-CM

## 2016-05-03 DIAGNOSIS — Z298 Encounter for other specified prophylactic measures: Secondary | ICD-10-CM

## 2016-05-03 DIAGNOSIS — R110 Nausea: Secondary | ICD-10-CM | POA: Diagnosis not present

## 2016-05-03 DIAGNOSIS — Z7189 Other specified counseling: Secondary | ICD-10-CM | POA: Diagnosis not present

## 2016-05-03 DIAGNOSIS — Z2989 Encounter for other specified prophylactic measures: Secondary | ICD-10-CM

## 2016-05-03 MED ORDER — ONDANSETRON HCL 8 MG PO TABS: 8.0000 mg | ORAL_TABLET | Freq: Three times a day (TID) | ORAL | 0 refills | Status: DC | PRN

## 2016-05-03 MED ORDER — DOXYCYCLINE HYCLATE 100 MG PO TABS: 100.0000 mg | ORAL_TABLET | Freq: Every day | ORAL | 0 refills | Status: AC

## 2016-05-03 MED ORDER — CIPROFLOXACIN HCL 500 MG PO TABS: 500.0000 mg | ORAL_TABLET | Freq: Two times a day (BID) | ORAL | 0 refills | Status: AC

## 2016-05-03 NOTE — Progress Notes (Signed)
   Yolanda Christian  MRN: [id number] DOB: Aug 27, 1980  PCP: No primary care provider on file.  Subjective:  Pt is a 36 year old female who presents to clinic for travel advice. Feeling well today, no complaints She is headed to the [place] next week for 6 days to celebrate her and her husband's 10 year anniversary.  H/o nausea with travel on planes and boats. No vomiting. She has tried travel bracelets, don't work. She would like Zophran, malaria prevention and something for traveller's diarrhea.    Review of Systems  Constitutional: Negative for chills, diaphoresis, fatigue and fever.  HENT: Negative for congestion, postnasal drip, rhinorrhea, sinus pressure, sneezing and sore throat.   Respiratory: Negative for cough, chest tightness, shortness of breath and wheezing.   Cardiovascular: Negative for chest pain and palpitations.  Gastrointestinal: Negative for abdominal pain, constipation, diarrhea, nausea and vomiting.  Neurological: Negative for weakness, light-headedness and headaches.    Patient Active Problem List   Diagnosis Date Noted  . Twin gestation in third trimester 06/18/2014  . S/P cesarean section 06/18/2014  . Breech presentation   . Normal pregnancy, repeat 02/16/2012    Current Outpatient Prescriptions on File Prior to Visit  Medication Sig Dispense Refill  . ibuprofen (ADVIL,MOTRIN) 600 MG tablet Take 1 tablet (600 mg total) by mouth every 6 (six) hours. 30 tablet 0   No current facility-administered medications on file prior to visit.     No Known Allergies   Objective:  BP 118/76 (BP Location: Right Arm, Patient Position: Sitting, Cuff Size: Normal)   Pulse 85   Temp 98.2 F (36.8 C) (Oral)   Resp 16   Ht 5\' 8"  (1.727 m)   Wt 147 lb (66.7 kg)   LMP 05/02/2016 (Exact Date)   SpO2 98%   BMI 22.35 kg/m   Physical Exam  Constitutional: She is oriented to person, place, and time and well-developed, well-nourished, and in no distress. No  distress.  Cardiovascular: Normal rate, regular rhythm and normal heart sounds.   Pulmonary/Chest: Effort normal. No respiratory distress.  Abdominal: Soft. There is no tenderness.  Neurological: She is alert and oriented to person, place, and time. GCS score is 15.  Skin: Skin is warm and dry.  Psychiatric: Mood, memory, affect and judgment normal.  Vitals reviewed.   Assessment and Plan :  1. Encounter for counseling for travel - Encouraged pt to review CDC guidelines for the area where she is going, and keep the number on hand for [place] embassy. Call with questions.  2. Need for malaria prophylaxis - doxycycline (VIBRA-TABS) 100 MG tablet; Take 1 tablet (100 mg total) by mouth daily.  Dispense: 38 tablet; Refill: 0  3. Nausea without vomiting - ondansetron (ZOFRAN) 8 MG tablet; Take 1 tablet (8 mg total) by mouth every 8 (eight) hours as needed for nausea or vomiting.  Dispense: 20 tablet; Refill: 0  4. Traveler's diarrhea - ciprofloxacin (CIPRO) 500 MG tablet; Take 1 tablet (500 mg total) by mouth 2 (two) times daily.  Dispense: 14 tablet; Refill: 0    Mercer Pod, PA-C  [facility] 05/03/2016 2:16 PM

## 2016-05-03 NOTE — Patient Instructions (Addendum)
Malaria prophylaxis - Take Doxycycline daily beginning one to two days prior to exposure, daily during exposure, and daily for four weeks following exposure  Have fun on your trip!  Thank you for coming in today. I hope you feel we met your needs.  Feel free to call [facility] if you have any questions or further requests.  Please consider signing up for MyChart if you do not already have it, as this is a great way to communicate with me.  Best,  Whitney McVey, PA-C   IF you received an x-ray today, you will receive an invoice from [facility]. Please contact [facility] at [phone] with questions or concerns regarding your invoice.   IF you received labwork today, you will receive an invoice from [place] [facility]. Please contact [facility] at [phone] with questions or concerns regarding your invoice.   Our billing staff will not be able to assist you with questions regarding bills from these companies.  You will be contacted with the lab results as soon as they are available. The fastest way to get your results is to activate your My Chart account. Instructions are located on the last page of this paperwork. If you have not heard from [place] regarding the results in 2 weeks, please contact this office.

## 2016-05-10 ENCOUNTER — Other Ambulatory Visit: Payer: Self-pay | Admitting: Physician Assistant

## 2016-05-10 ENCOUNTER — Telehealth: Payer: Self-pay

## 2016-05-10 DIAGNOSIS — Z20828 Contact with and (suspected) exposure to other viral communicable diseases: Secondary | ICD-10-CM

## 2016-05-10 MED ORDER — OSELTAMIVIR PHOSPHATE 75 MG PO CAPS: 75.0000 mg | ORAL_CAPSULE | Freq: Every day | ORAL | 0 refills | Status: AC

## 2016-05-10 NOTE — Telephone Encounter (Signed)
Please advise 

## 2016-05-10 NOTE — Telephone Encounter (Signed)
Done. Thank you.

## 2016-05-10 NOTE — Telephone Encounter (Signed)
PATIENT STATES SHE SAW [facility] LAST Monday TO GET SHOTS BECAUSE SHE IS GOING TO THE [place] HER DAUGHTER SAW HER PEDIATRICIAN THIS MORNING AND WAS DIAGNOSED WITH FLU A. HER DOCTOR SUGGESTED WE CALL IN TAMIFLU FOR HER AND HER HUSBAND. HIS NAME IS MICHAEL Grafton (DOB 09/28/78) AND HE ALSO CAME IN LAST WEEK TO GET SHOTS AND SAW CHELLE. NEITHER OF THEM ARE HAVING ANY  SYMPTOMS. (I WILL PUT THIS IN AS HIGH PRIORITY ONLY BECAUSE THEY WILL BE LEAVING ON Wednesday). BEST PHONE [phone] (CELL) PHARMACY CHOICE IS [facility] [address] [place]

## 2016-05-11 NOTE — Telephone Encounter (Signed)
L/m tamiflu rxd

## 2017-01-03 DIAGNOSIS — H5213 Myopia, bilateral: Secondary | ICD-10-CM | POA: Diagnosis not present

## 2017-02-23 DIAGNOSIS — Z01419 Encounter for gynecological examination (general) (routine) without abnormal findings: Secondary | ICD-10-CM | POA: Diagnosis not present

## 2017-02-23 DIAGNOSIS — Z6824 Body mass index (BMI) 24.0-24.9, adult: Secondary | ICD-10-CM | POA: Diagnosis not present

## 2017-02-23 DIAGNOSIS — Z1389 Encounter for screening for other disorder: Secondary | ICD-10-CM | POA: Diagnosis not present

## 2017-02-23 DIAGNOSIS — Z124 Encounter for screening for malignant neoplasm of cervix: Secondary | ICD-10-CM | POA: Diagnosis not present

## 2017-02-23 DIAGNOSIS — Z1151 Encounter for screening for human papillomavirus (HPV): Secondary | ICD-10-CM | POA: Diagnosis not present

## 2017-02-24 DIAGNOSIS — Z124 Encounter for screening for malignant neoplasm of cervix: Secondary | ICD-10-CM | POA: Diagnosis not present

## 2017-08-15 ENCOUNTER — Ambulatory Visit (INDEPENDENT_AMBULATORY_CARE_PROVIDER_SITE_OTHER): Payer: 59 | Admitting: Physician Assistant

## 2017-08-15 ENCOUNTER — Ambulatory Visit: Payer: 59 | Admitting: Physician Assistant

## 2017-08-15 ENCOUNTER — Encounter: Payer: Self-pay | Admitting: Physician Assistant

## 2017-08-15 ENCOUNTER — Other Ambulatory Visit: Payer: Self-pay

## 2017-08-15 VITALS — BP 102/70 | HR 74 | Temp 98.9°F | Resp 16 | Ht 68.0 in | Wt 153.0 lb

## 2017-08-15 DIAGNOSIS — B9789 Other viral agents as the cause of diseases classified elsewhere: Secondary | ICD-10-CM | POA: Diagnosis not present

## 2017-08-15 DIAGNOSIS — J069 Acute upper respiratory infection, unspecified: Secondary | ICD-10-CM

## 2017-08-15 MED ORDER — HYDROCODONE-HOMATROPINE 5-1.5 MG/5ML PO SYRP: 2.5000 mL | ORAL_SOLUTION | Freq: Four times a day (QID) | ORAL | 0 refills | Status: DC | PRN

## 2017-08-15 MED ORDER — BENZONATATE 200 MG PO CAPS: 200.0000 mg | ORAL_CAPSULE | Freq: Two times a day (BID) | ORAL | 0 refills | Status: DC | PRN

## 2017-08-15 MED ORDER — HYDROCODONE-HOMATROPINE 5-1.5 MG/5ML PO SYRP: 2.5000 mL | ORAL_SOLUTION | Freq: Four times a day (QID) | ORAL | 0 refills | Status: AC | PRN

## 2017-08-15 NOTE — Progress Notes (Signed)
08/16/2017 5:03 PM   DOB: July 26, 1980 / MRN: [id number]  SUBJECTIVE:  Yolanda Christian is a 37 y.o. female presenting for cough x five days.  No fever.  Feels she has a cold but can't sleep due to cough.  Has 3 kids at home that she has to care for and has to rest at night.   She has No Known Allergies.   She  has a past medical history of Abnormal Pap smear, Coccyx disorder, Hemorrhoids, and Normal pregnancy, repeat (02/16/2012).    She  reports that she has never smoked. She has never used smokeless tobacco. She reports that she does not drink alcohol or use drugs. She  reports that she currently engages in sexual activity. The patient  has a past surgical history that includes Colposcopy; Adenoidectomy; and Cesarean section (N/A, 06/18/2014).  Her family history includes Cancer in her maternal grandfather; Diabetes in her father; Hypertension in her father.  Review of Systems  Constitutional: Negative for diaphoresis.  HENT: Positive for congestion and sore throat.   Respiratory: Positive for cough. Negative for hemoptysis, sputum production, shortness of breath and wheezing.   Cardiovascular: Negative for chest pain, orthopnea and leg swelling.  Gastrointestinal: Negative for nausea.  Neurological: Negative for dizziness.    The problem list and medications were reviewed and updated by myself where necessary and exist elsewhere in the encounter.   OBJECTIVE:  BP 102/70   Pulse 74   Temp 98.9 F (37.2 C)   Resp 16   Ht 5\' 8"  (1.727 m)   Wt 153 lb (69.4 kg)   SpO2 98%   BMI 23.26 kg/m   Physical Exam  Constitutional: She is oriented to person, place, and time. She appears well-nourished. No distress.  Eyes: Pupils are equal, round, and reactive to light. EOM are normal.  Cardiovascular: Normal rate, regular rhythm, S1 normal, S2 normal, normal heart sounds and intact distal pulses. Exam reveals no gallop, no friction rub and no decreased pulses.  No murmur  heard. Pulmonary/Chest: Effort normal. No stridor. No respiratory distress. She has no wheezes. She has no rales.  Abdominal: She exhibits no distension.  Musculoskeletal: She exhibits no edema.  Neurological: She is alert and oriented to person, place, and time. No cranial nerve deficit. Gait normal.  Skin: Skin is dry. She is not diaphoretic.  Psychiatric: She has a normal mood and affect.  Vitals reviewed.   No results found for this or any previous visit (from the past 72 hour(s)).  No results found.  ASSESSMENT AND PLAN:  Shifa was seen today for uri.  Diagnoses and all orders for this visit:  Viral URI with cough  Other orders -     Discontinue: HYDROcodone-homatropine (HYCODAN) 5-1.5 MG/5ML syrup; Take 2.5-5 mLs by mouth every 6 (six) hours as needed for up to 5 days for cough. Do not take this medication while breast feeding. -     benzonatate (TESSALON) 200 MG capsule; Take 1 capsule (200 mg total) by mouth 2 (two) times daily as needed for cough. -     HYDROcodone-homatropine (HYCODAN) 5-1.5 MG/5ML syrup; Take 2.5-5 mLs by mouth every 6 (six) hours as needed for up to 5 days for cough. Do not take this medication while breast feeding.    The patient is advised to call or return to clinic if she does not see an improvement in symptoms, or to seek the care of the closest emergency department if she worsens with the above plan.  Philis Fendt, MHS, PA-C [facility] 08/16/2017 5:03 PM

## 2017-08-15 NOTE — Patient Instructions (Addendum)
Use the cough syrup sparingly.  Tessalon should also be helpful.     IF you received an x-ray today, you will receive an invoice from [facility]. Please contact [facility] at [phone] with questions or concerns regarding your invoice.   IF you received labwork today, you will receive an invoice from [facility]. Please contact [facility] at [phone] with questions or concerns regarding your invoice.   Our billing staff will not be able to assist you with questions regarding bills from these companies.  You will be contacted with the lab results as soon as they are available. The fastest way to get your results is to activate your My Chart account. Instructions are located on the last page of this paperwork. If you have not heard from [place] regarding the results in 2 weeks, please contact this office.

## 2018-06-07 DIAGNOSIS — Z973 Presence of spectacles and contact lenses: Secondary | ICD-10-CM | POA: Diagnosis not present

## 2018-06-07 DIAGNOSIS — H5213 Myopia, bilateral: Secondary | ICD-10-CM | POA: Diagnosis not present

## 2018-08-11 DIAGNOSIS — Z0000 Encounter for general adult medical examination without abnormal findings: Secondary | ICD-10-CM | POA: Diagnosis not present

## 2018-08-28 DIAGNOSIS — D485 Neoplasm of uncertain behavior of skin: Secondary | ICD-10-CM | POA: Diagnosis not present

## 2018-08-28 DIAGNOSIS — D225 Melanocytic nevi of trunk: Secondary | ICD-10-CM | POA: Diagnosis not present

## 2018-08-28 DIAGNOSIS — D2239 Melanocytic nevi of other parts of face: Secondary | ICD-10-CM | POA: Diagnosis not present

## 2018-09-12 DIAGNOSIS — J352 Hypertrophy of adenoids: Secondary | ICD-10-CM | POA: Diagnosis not present

## 2018-09-12 DIAGNOSIS — D1801 Hemangioma of skin and subcutaneous tissue: Secondary | ICD-10-CM | POA: Diagnosis not present

## 2018-09-12 DIAGNOSIS — D485 Neoplasm of uncertain behavior of skin: Secondary | ICD-10-CM | POA: Diagnosis not present

## 2019-03-16 ENCOUNTER — Other Ambulatory Visit: Payer: Self-pay

## 2019-03-16 DIAGNOSIS — Z20828 Contact with and (suspected) exposure to other viral communicable diseases: Secondary | ICD-10-CM | POA: Diagnosis not present

## 2019-03-16 DIAGNOSIS — Z20822 Contact with and (suspected) exposure to covid-19: Secondary | ICD-10-CM

## 2019-03-17 LAB — NOVEL CORONAVIRUS, NAA: SARS-CoV-2, NAA: NOT DETECTED

## 2019-11-23 ENCOUNTER — Other Ambulatory Visit: Payer: Self-pay

## 2019-11-23 ENCOUNTER — Encounter (INDEPENDENT_AMBULATORY_CARE_PROVIDER_SITE_OTHER): Payer: 59 | Admitting: Ophthalmology

## 2019-11-23 DIAGNOSIS — H35411 Lattice degeneration of retina, right eye: Secondary | ICD-10-CM

## 2019-11-23 DIAGNOSIS — H33301 Unspecified retinal break, right eye: Secondary | ICD-10-CM | POA: Diagnosis not present

## 2019-11-23 DIAGNOSIS — H5213 Myopia, bilateral: Secondary | ICD-10-CM

## 2019-11-23 DIAGNOSIS — H43813 Vitreous degeneration, bilateral: Secondary | ICD-10-CM

## 2019-11-28 ENCOUNTER — Encounter (INDEPENDENT_AMBULATORY_CARE_PROVIDER_SITE_OTHER): Payer: No Typology Code available for payment source | Admitting: Ophthalmology

## 2020-01-11 ENCOUNTER — Encounter (INDEPENDENT_AMBULATORY_CARE_PROVIDER_SITE_OTHER): Payer: No Typology Code available for payment source | Admitting: Ophthalmology

## 2020-01-11 ENCOUNTER — Other Ambulatory Visit: Payer: Self-pay

## 2020-01-11 DIAGNOSIS — H33301 Unspecified retinal break, right eye: Secondary | ICD-10-CM

## 2020-01-21 ENCOUNTER — Other Ambulatory Visit: Payer: Self-pay

## 2020-01-21 ENCOUNTER — Encounter (INDEPENDENT_AMBULATORY_CARE_PROVIDER_SITE_OTHER): Payer: No Typology Code available for payment source | Admitting: Ophthalmology

## 2020-01-21 DIAGNOSIS — H33301 Unspecified retinal break, right eye: Secondary | ICD-10-CM

## 2020-02-01 ENCOUNTER — Encounter (INDEPENDENT_AMBULATORY_CARE_PROVIDER_SITE_OTHER): Payer: No Typology Code available for payment source | Admitting: Ophthalmology

## 2020-05-26 ENCOUNTER — Encounter (INDEPENDENT_AMBULATORY_CARE_PROVIDER_SITE_OTHER): Payer: No Typology Code available for payment source | Admitting: Ophthalmology

## 2020-05-26 ENCOUNTER — Other Ambulatory Visit: Payer: Self-pay

## 2020-05-26 DIAGNOSIS — H33301 Unspecified retinal break, right eye: Secondary | ICD-10-CM | POA: Diagnosis not present

## 2020-05-26 DIAGNOSIS — H5213 Myopia, bilateral: Secondary | ICD-10-CM | POA: Diagnosis not present

## 2020-05-26 DIAGNOSIS — H43813 Vitreous degeneration, bilateral: Secondary | ICD-10-CM | POA: Diagnosis not present

## 2020-11-24 ENCOUNTER — Other Ambulatory Visit: Payer: Self-pay

## 2020-11-24 ENCOUNTER — Encounter (INDEPENDENT_AMBULATORY_CARE_PROVIDER_SITE_OTHER): Payer: No Typology Code available for payment source | Admitting: Ophthalmology

## 2020-11-24 DIAGNOSIS — H43813 Vitreous degeneration, bilateral: Secondary | ICD-10-CM | POA: Diagnosis not present

## 2020-11-24 DIAGNOSIS — H33011 Retinal detachment with single break, right eye: Secondary | ICD-10-CM

## 2020-12-03 ENCOUNTER — Other Ambulatory Visit: Payer: Self-pay

## 2020-12-03 ENCOUNTER — Encounter (INDEPENDENT_AMBULATORY_CARE_PROVIDER_SITE_OTHER): Payer: No Typology Code available for payment source | Admitting: Ophthalmology

## 2020-12-03 DIAGNOSIS — H33301 Unspecified retinal break, right eye: Secondary | ICD-10-CM

## 2021-04-06 ENCOUNTER — Encounter (INDEPENDENT_AMBULATORY_CARE_PROVIDER_SITE_OTHER): Payer: No Typology Code available for payment source | Admitting: Ophthalmology

## 2021-04-06 ENCOUNTER — Other Ambulatory Visit: Payer: Self-pay

## 2021-04-06 DIAGNOSIS — H33301 Unspecified retinal break, right eye: Secondary | ICD-10-CM | POA: Diagnosis not present

## 2021-04-06 DIAGNOSIS — H43813 Vitreous degeneration, bilateral: Secondary | ICD-10-CM | POA: Diagnosis not present

## 2021-08-03 ENCOUNTER — Encounter: Payer: Self-pay | Admitting: Emergency Medicine

## 2021-08-03 ENCOUNTER — Ambulatory Visit
Admission: EM | Admit: 2021-08-03 | Discharge: 2021-08-03 | Disposition: A | Discharge status: Home / Self Care | Payer: No Typology Code available for payment source | Attending: Urgent Care | Admitting: Urgent Care

## 2021-08-03 DIAGNOSIS — R070 Pain in throat: Secondary | ICD-10-CM | POA: Diagnosis not present

## 2021-08-03 DIAGNOSIS — R509 Fever, unspecified: Secondary | ICD-10-CM | POA: Insufficient documentation

## 2021-08-03 DIAGNOSIS — R0982 Postnasal drip: Secondary | ICD-10-CM | POA: Insufficient documentation

## 2021-08-03 DIAGNOSIS — J029 Acute pharyngitis, unspecified: Secondary | ICD-10-CM | POA: Insufficient documentation

## 2021-08-03 LAB — POCT RAPID STREP A (OFFICE): Rapid Strep A Screen: NEGATIVE

## 2021-08-03 NOTE — ED Provider Notes (Signed)
?[place] ? ? ?MRN: [id number] DOB: 07/29/1980 ? ?Subjective:  ? ?Yolanda Christian is a 41 y.o. female presenting for 1 day history of acute onset fever, throat pain, painful swallowing.  No cough, chest pain, shortness of breath.  Patient's primary concern is to make sure she does not have strep throat.  No sick contacts to her knowledge with strep throat but she does have a 43-year-old son that has had some fevers believes he has a virus.  Patient does have a history of allergies but has not started her allergy medications. ? ?No current facility-administered medications for this encounter. ? ?Current Outpatient Medications:  ?  benzonatate (TESSALON) 200 MG capsule, Take 1 capsule (200 mg total) by mouth 2 (two) times daily as needed for cough., Disp: 20 capsule, Rfl: 0 ?  ibuprofen (ADVIL,MOTRIN) 600 MG tablet, Take 1 tablet (600 mg total) by mouth every 6 (six) hours., Disp: 30 tablet, Rfl: 0 ?  ondansetron (ZOFRAN) 8 MG tablet, Take 1 tablet (8 mg total) by mouth every 8 (eight) hours as needed for nausea or vomiting. (Patient not taking: Reported on 08/15/2017), Disp: 20 tablet, Rfl: 0  ? ?No Known Allergies ? ?Past Medical History:  ?Diagnosis Date  ? Abnormal Pap smear   ? Coccyx disorder   ? positional tender on/off  ? Hemorrhoids   ? Normal pregnancy, repeat 02/16/2012  ?  ? ?Past Surgical History:  ?Procedure Laterality Date  ? ADENOIDECTOMY    ? CESAREAN SECTION N/A 06/18/2014  ? Procedure: CESAREAN SECTION;  Surgeon: Newton Pigg, MD;  Location: [facility];  Service: Obstetrics;  Laterality: N/A;  ? COLPOSCOPY    ? ? ?Family History  ?Problem Relation Age of Onset  ? Hypertension Father   ? Diabetes Father   ? Cancer Maternal Grandfather   ?     lung  ? ? ?Social History  ? ?Tobacco Use  ? Smoking status: Never  ? Smokeless tobacco: Never  ?Substance Use Topics  ? Alcohol use: No  ? Drug use: No  ? ? ?ROS ? ? ?Objective:  ? ?Vitals: ?BP 115/75   Pulse 83   Temp 99.6 ?F (37.6 ?C)    Resp 20   SpO2 98%  ? ?Physical Exam ?Constitutional:   ?   General: She is not in acute distress. ?   Appearance: Normal appearance. She is well-developed. She is not ill-appearing, toxic-appearing or diaphoretic.  ?HENT:  ?   Head: Normocephalic and atraumatic.  ?   Nose: Nose normal.  ?   Mouth/Throat:  ?   Mouth: Mucous membranes are moist.  ?   Pharynx: No pharyngeal swelling, oropharyngeal exudate, posterior oropharyngeal erythema or uvula swelling.  ?   Tonsils: No tonsillar exudate or tonsillar abscesses. 0 on the right. 0 on the left.  ?   Comments: Thick streaks of postnasal drainage overlying pharynx.  A small tonsillith over right tonsil. ?Eyes:  ?   General: No scleral icterus.    ?   Right eye: No discharge.     ?   Left eye: No discharge.  ?   Extraocular Movements: Extraocular movements intact.  ?Cardiovascular:  ?   Rate and Rhythm: Normal rate.  ?Pulmonary:  ?   Effort: Pulmonary effort is normal.  ?Skin: ?   General: Skin is warm and dry.  ?Neurological:  ?   General: No focal deficit present.  ?   Mental Status: She is alert and oriented to person,  place, and time.  ?Psychiatric:     ?   Mood and Affect: Mood normal.     ?   Behavior: Behavior normal.  ? ? ?Results for orders placed or performed during the hospital encounter of 08/03/21 (from the past 24 hour(s))  ?POCT rapid strep A     Status: Normal  ? Collection Time: 08/03/21  7:25 PM  ?Result Value Ref Range  ? Rapid Strep A Screen Negative Negative  ? ? ?Assessment and Plan :  ? ?PDMP not reviewed this encounter. ? ?1. Throat pain   ?2. Fever, unspecified   ?3. Post-nasal drainage   ?4. Viral pharyngitis   ? ? ?Recommend supportive care for now for viral pharyngitis.  Strep culture pending. Counseled patient on potential for adverse effects with medications prescribed/recommended today, ER and return-to-clinic precautions discussed, patient verbalized understanding. ? ?  ?Jaynee Eagles, PA-C ?08/03/21 1948 ? ?

## 2021-08-03 NOTE — ED Triage Notes (Signed)
Pt here with sore throat and fever starting last night.  ?

## 2021-08-05 ENCOUNTER — Telehealth (HOSPITAL_COMMUNITY): Payer: Self-pay | Admitting: Emergency Medicine

## 2021-08-05 LAB — CULTURE, GROUP A STREP (THRC)

## 2021-08-05 MED ORDER — AMOXICILLIN 500 MG PO CAPS: 500.0000 mg | ORAL_CAPSULE | Freq: Two times a day (BID) | ORAL | 0 refills | Status: AC

## 2021-11-27 ENCOUNTER — Ambulatory Visit (INDEPENDENT_AMBULATORY_CARE_PROVIDER_SITE_OTHER): Payer: 59 | Admitting: Nurse Practitioner

## 2021-11-27 ENCOUNTER — Encounter (HOSPITAL_BASED_OUTPATIENT_CLINIC_OR_DEPARTMENT_OTHER): Payer: Self-pay | Admitting: Nurse Practitioner

## 2021-11-27 VITALS — BP 106/70 | HR 71 | Ht 66.0 in | Wt 160.0 lb

## 2021-11-27 DIAGNOSIS — Z1231 Encounter for screening mammogram for malignant neoplasm of breast: Secondary | ICD-10-CM

## 2021-11-27 DIAGNOSIS — R239 Unspecified skin changes: Secondary | ICD-10-CM | POA: Diagnosis not present

## 2021-11-27 DIAGNOSIS — Z0000 Encounter for general adult medical examination without abnormal findings: Secondary | ICD-10-CM

## 2021-11-27 NOTE — Assessment & Plan Note (Signed)
New patient for CPE today. Discussion of HM recommendations. She plans to schedule her pap in the near future with GYN ([facility]), she will get her flu vaccine in the next few weeks with work, we have ordered first mammogram today.  Labs today for evaluation.  No alarm symptoms Will plan to follow-up in a year or sooner if needed.

## 2021-11-27 NOTE — Progress Notes (Signed)
BP 106/70   Pulse 71   Ht '5\' 6"'$  (1.676 m)   Wt 160 lb (72.6 kg)   SpO2 99%   BMI 25.82 kg/m    Subjective:    Patient ID: Yolanda Christian, female    DOB: 1981-03-21, 41 y.o.   MRN: [id number]  HPI: Yolanda Christian is a 41 y.o. female presenting on 11/27/2021 for comprehensive medical examination.   Current medical concerns include: She reports oval shaped eyes that wear holes in the retina and cause tearing requiring repair.  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  She  endorses abnormal bleeding- with some irregularity about every 4 months. Mom went into menopause Yolanda Christian She  endorses menopausal symptoms.  She is is sexually active. She currently has 1 sexual partners.  She denies concerns today about STI: testing ordered: no  She denies concerns about skin changes today  She denies concerns about bowel changes today  She denies concerns about bladder changes today   Most Recent Depression Screen:     05/03/2016    1:56 PM 05/14/2015   10:12 AM  Depression screen PHQ 2/9  Decreased Interest 0 0  Down, Depressed, Hopeless 0 0  PHQ - 2 Score 0 0   Most Recent Anxiety Screen:      No data to display         Most Recent Fall Screen:    11/27/2021    9:00 AM 05/03/2016    1:56 PM  Fall Risk   Falls in the past year? 0 No  Number falls in past yr: 0   Injury with Fall? 0   Risk for fall due to : No Fall Risks   Follow up Falls evaluation completed;Education provided     All ROS negative except what is listed above and in the HPI.   Past medical history, surgical history,  medications, allergies, family history and social history reviewed with patient today and changes made to appropriate areas of the chart.  Past Medical History:  Past Medical History:  Diagnosis Date   Abnormal Pap smear    Coccyx disorder    positional tender on/off   Hemorrhoids    Normal pregnancy, repeat 02/16/2012   Medications:  Current Outpatient Medications on File Prior to Visit  Medication Sig   ibuprofen (ADVIL,MOTRIN) 600 MG tablet Take 1 tablet (600 mg total) by mouth every 6 (six) hours.   ondansetron (ZOFRAN) 8 MG tablet Take 1 tablet (8 mg total) by mouth every 8 (eight) hours as needed for nausea or vomiting.   No current facility-administered medications on file prior to visit.   Surgical History:  Past Surgical History:  Procedure Laterality Date   ADENOIDECTOMY     CESAREAN SECTION N/A 06/18/2014   Procedure: CESAREAN SECTION;  Surgeon: TNewton Pigg MD;  Location[facility];  Service: Obstetrics;  Laterality: N/A;   COLPOSCOPY     Allergies:  No Known Allergies Social History:  Social History   Socioeconomic History   Marital status: Single    Spouse name: Not  on file   Number of children: Not on file   Years of education: Not on file   Highest education level: Not on file  Occupational History   Not on file  Tobacco Use   Smoking status: Never   Smokeless tobacco: Never  Substance and Sexual Activity   Alcohol use: No   Drug use: No   Sexual activity: Yes  Other Topics Concern   Not on file  Social History Narrative   Not on file   Social Determinants of Health   Financial Resource Strain: Not on file  Food Insecurity: Not on file  Transportation Needs: Not on file  Physical Activity: Not on file  Stress: Not on file  Social Connections: Not on file  Intimate Partner Violence: Not on file   Social History   Tobacco Use  Smoking Status Never  Smokeless Tobacco Never   Social History   Substance and Sexual Activity  Alcohol Use No    Family History:  Family History  Problem Relation Age of Onset   Hypertension Father    Diabetes Father    Cancer Maternal Grandfather        lung       Objective:    BP 106/70   Pulse 71   Ht '5\' 6"'$  (1.676 m)   Wt 160 lb (72.6 kg)   SpO2 99%   BMI 25.82 kg/m   Wt Readings from Last 3 Encounters:  11/27/21 160 lb (72.6 kg)  08/15/17 153 lb (69.4 kg)  05/03/16 147 lb (66.7 kg)    Physical Exam Vitals and nursing note reviewed.  Constitutional:      General: She is not in acute distress.    Appearance: Normal appearance.  HENT:     Head: Normocephalic and atraumatic.     Right Ear: Hearing, tympanic membrane, ear canal and external ear normal.     Left Ear: Hearing, tympanic membrane, ear canal and external ear normal.     Nose: Nose normal.     Right Sinus: No maxillary sinus tenderness or frontal sinus tenderness.     Left Sinus: No maxillary sinus tenderness or frontal sinus tenderness.     Mouth/Throat:     Lips: Pink.     Mouth: Mucous membranes are moist.     Pharynx: Oropharynx is clear.  Eyes:     General: Lids are normal. Vision grossly intact.     Extraocular Movements: Extraocular movements intact.     Conjunctiva/sclera: Conjunctivae normal.     Pupils: Pupils are equal, round, and reactive to light.     Funduscopic exam:    Right eye: Red reflex present.        Left eye: Red reflex present.    Visual Fields: Right eye visual fields normal and left eye visual fields normal.  Neck:     Thyroid: No thyromegaly.     Vascular: No carotid bruit.   Cardiovascular:     Rate and Rhythm: Normal rate and regular rhythm.     Chest Wall: PMI is not displaced.     Pulses: Normal pulses.          Dorsalis pedis pulses are 2+ on the right side and 2+ on the left side.       Posterior tibial pulses are 2+ on the right side and 2+ on the left side.     Heart sounds: Normal heart sounds. No murmur heard. Pulmonary:     Effort: Pulmonary effort is normal.  No  respiratory distress.     Breath sounds: Normal breath sounds.  Abdominal:     General: Abdomen is flat. Bowel sounds are normal. There is no distension.     Palpations: Abdomen is soft. There is no hepatomegaly, splenomegaly or mass.     Tenderness: There is no abdominal tenderness. There is no right CVA tenderness, left CVA tenderness, guarding or rebound.  Musculoskeletal:        General: Normal range of motion.     Cervical back: Full passive range of motion without pain, normal range of motion and neck supple. No tenderness.     Right lower leg: No edema.     Left lower leg: No edema.  Feet:     Left foot:     Toenail Condition: Left toenails are normal.  Lymphadenopathy:     Cervical: No cervical adenopathy.     Upper Body:     Right upper body: No supraclavicular adenopathy.     Left upper body: No supraclavicular adenopathy.  Skin:    General: Skin is warm and dry.     Capillary Refill: Capillary refill takes less than 2 seconds.     Findings: Rash present.     Nails: There is no clubbing.  Neurological:     General: No focal deficit present.     Mental Status: She is alert and oriented to person, place, and time.     GCS: GCS eye subscore is 4. GCS verbal subscore is 5. GCS motor subscore is 6.     Sensory: Sensation is intact.     Motor: Motor function is intact.     Coordination: Coordination is intact.     Gait: Gait is intact.     Deep Tendon Reflexes: Reflexes are normal and symmetric.  Psychiatric:        Attention and Perception: Attention normal.        Mood and Affect: Mood normal.        Speech: Speech normal.        Behavior: Behavior normal. Behavior is cooperative.        Thought Content: Thought content normal.        Cognition and Memory: Cognition and memory normal.        Judgment: Judgment normal.     Results for orders placed or performed during the hospital encounter of 08/03/21  Culture, group A strep   Specimen: Throat  Result Value Ref  Range   Specimen Description THROAT    Special Requests NONE    Culture      MODERATE GROUP A STREP (S.PYOGENES) ISOLATED Beta hemolytic streptococci are predictably susceptible to penicillin and other beta lactams. Susceptibility testing not routinely performed. Performed at [facility], [place] [address] [place], [place] [address]    Report Status 08/05/2021 FINAL   POCT rapid strep A  Result Value Ref Range   Rapid Strep A Screen Negative Negative    MMUNIZATIONS:   - Tdap: Tetanus vaccination status reviewed: last tetanus booster within 10 years. - Influenza: Postponed to flu season - Pneumovax: Not applicable - Prevnar: Not applicable - HPV: Not applicable - Zostavax vaccine: Not applicable  SCREENING COMPLETED: - Pap smear:  due now - she is planning to schedule - STI testing:Declined -Mammogram: Ordered today - Colonoscopy: {Not Applicable - Bone Density: {Not Applicable -Hearing Test: Not Applicable -Spirometry: Not Applicable  Scaling patch on the right side of the head behind the ear- present since May  Assessment & Plan:   Problem List Items Addressed This Visit     Encounter for annual physical exam - Primary    New patient for CPE today. Discussion of HM recommendations. She plans to schedule her pap in the near future with [facility]), she will get her flu vaccine in the next few weeks with work, we have ordered first mammogram today.  Labs today for evaluation.  No alarm symptoms Will plan to follow-up in a year or sooner if needed.       Recent skin changes    Appx 7-88m scaling patch with erythematous base located on the right occipital region near the mastoid process. Present since May. Scaling will scratch off then come back. Has not had a skin check. No hx of skin changes. Will send referral to dermatology today for evaluation. If unable to get in with dermatology in reasonable amount of time (2 months or less) will plan to have  her come back and we will biopsy the area. Patient expressed understanding.       Relevant Orders   Ambulatory referral to Dermatology   Other Visit Diagnoses     Screening mammogram for breast cancer       Relevant Orders   MM 3D SCREEN BREAST BILATERAL   Health care maintenance       Relevant Orders   Comprehensive metabolic panel   CBC With Diff/Platelet   Thyroid Panel With TSH   VITAMIN D 25 Hydroxy (Vit-D Deficiency, Fractures)          Follow up plan: Return in about 1 year (around 11/28/2022) for CPE.  NEXT PREVENTATIVE PHYSICAL DUE IN 1 YEAR.  PATIENT COUNSELING PROVIDED FOR ALL ADULT PATIENTS:  Consume a well balanced diet low in saturated fats, cholesterol, and moderation in carbohydrates.   This can be as simple as monitoring portion sizes and cutting back on sugary beverages such as soda and juice to start with.    Daily water consumption of at least 64 ounces.  Physical activity at least 180 minutes per week, if just starting out.   This can be as simple as taking the stairs instead of the elevator and walking 2-3 laps around the office  purposefully every day.   STD protection, partner selection, and regular testing if high risk.  Limited consumption of alcoholic beverages if alcohol is consumed.  For women, I recommend no more than 7 alcoholic beverages per week, spread out throughout the week.  Avoid "binge" drinking or consuming large quantities of alcohol in one setting.   Please let me know if you feel you may need help with reduction or quitting alcohol consumption.   Avoidance of nicotine, if used.  Please let me know if you feel you may need help with reduction or quitting nicotine use.   Daily mental health attention.  This can be in the form of 5 minute daily meditation, prayer, journaling, yoga, reflection, etc.   Purposeful attention to your emotions and mental state can significantly improve your overall wellbeing and Health.  Please know  that I am here to help you with all of your health care goals and am happy to work with you to find a solution that works best for you.  The greatest advice I have received with any changes in life are to take it one step at a time, that even means if all you can focus on is the next 60 seconds, then do that and celebrate your  victories.  With any changes in life, you will have set backs, and that is OK. The important thing to remember is, if you have a set back, it is not a failure, it is an opportunity to try again!  Health Maintenance Recommendations Screening Testing Mammogram Every 1 -2 years based on history and risk factors Starting at age 51 Pap Smear Ages 21-39 every 3 years Ages 49-65 every 5 years with HPV testing More frequent testing may be required based on results and history Colon Cancer Screening Every 1-10 years based on test performed, risk factors, and history Starting at age 66 Bone Density Screening Every 2-10 years based on history Starting at age 31 for women Recommendations for men differ based on medication usage, history, and risk factors AAA Screening One time ultrasound Men 54-50 years old who have every smoked Lung Cancer Screening Low Dose Lung CT every 12 months Age 6-80 years with a 30 pack-year smoking history who still smoke or who have quit within the last 15 years  Screening Labs Routine  Labs: Complete Blood Count (CBC), Complete Metabolic Panel (CMP), Cholesterol (Lipid Panel) Every 6-12 months based on history and medications May be recommended more frequently based on current conditions or previous results Hemoglobin A1c Lab Every 3-12 months based on history and previous results Starting at age 43 or earlier with diagnosis of diabetes, high cholesterol, BMI >26, and/or risk factors Frequent monitoring for patients with diabetes to ensure blood sugar control Thyroid Panel (TSH w/ T3 & T4) Every 6 months based on history, symptoms, and risk  factors May be repeated more often if on medication HIV One time testing for all patients 56 and older May be repeated more frequently for patients with increased risk factors or exposure Hepatitis C One time testing for all patients 38 and older May be repeated more frequently for patients with increased risk factors or exposure Gonorrhea, Chlamydia Every 12 months for all sexually active persons 13-24 years Additional monitoring may be recommended for those who are considered high risk or who have symptoms PSA Men 47-46 years old with risk factors Additional screening may be recommended from age 79-69 based on risk factors, symptoms, and history  Vaccine Recommendations Tetanus Booster All adults every 10 years Flu Vaccine All patients 6 months and older every year COVID Vaccine All patients 12 years and older Initial dosing with booster May recommend additional booster based on age and health history HPV Vaccine 2 doses all patients age 27-26 Dosing may be considered for patients over 26 Shingles Vaccine (Shingrix) 2 doses all adults 109 years and older Pneumonia (Pneumovax 23) All adults 24 years and older May recommend earlier dosing based on health history Pneumonia (Prevnar 39) All adults 9 years and older Dosed 1 year after Pneumovax 23  Additional Screening, Testing, and Vaccinations may be recommended on an individualized basis based on family history, health history, risk factors, and/or exposure.

## 2021-11-27 NOTE — Assessment & Plan Note (Signed)
Appx 7-53m scaling patch with erythematous base located on the right occipital region near the mastoid process. Present since May. Scaling will scratch off then come back. Has not had a skin check. No hx of skin changes. Will send referral to dermatology today for evaluation. If unable to get in with dermatology in reasonable amount of time (2 months or less) will plan to have her come back and we will biopsy the area. Patient expressed understanding.

## 2021-11-27 NOTE — Patient Instructions (Addendum)
Thank you for choosing [place] [facility] for your Primary Care needs. I am excited for the opportunity to partner with you to meet your health care goals. It was a pleasure meeting you today!  Recommendations from today's visit: Your physical exam looks great today! I will let you know what your labs show.  I have sent the referral to dermatology for you. If it takes a while to get in, please let me know and I will be happy to remove it for you.   Information on diet, exercise, and health maintenance recommendations are listed below. This is information to help you be sure you are on track for optimal health and monitoring.   Please look over this and let us know if you have any questions or if you have completed any of the health maintenance outside of [place] so that we can be sure your records are up to date.  ___________________________________________________________ About Me: I am an Adult-Geriatric Nurse Practitioner with a background in caring for patients for more than 20 years with a strong intensive care background. I provide primary care and sports medicine services to patients age 76 and older within this office. My education had a strong focus on caring for the older adult population, which I am passionate about. I am also the director of the APP Fellowship with [facility].   My desire is to provide you with the best service through preventive medicine and supportive care. I consider you a part of the medical team and value your input. I work diligently to ensure that you are heard and your needs are met in a safe and effective manner. I want you to feel comfortable with me as your provider and want you to know that your health concerns are important to me.  For your information, our office hours are: Monday, Tuesday, and Thursday 8:00 AM - 5:00 PM Wednesday and Friday 8:00 AM - 12:00 PM.   In my time away from the office I am teaching new APP's  within the system and am unavailable, but my partner, Dr. Burnard Bunting is in the office for emergent needs.   If you have questions or concerns, please call our office at [phone] or send [place] a MyChart message and we will respond as quickly as possible.  ____________________________________________________________ MyChart:  For all urgent or time sensitive needs we ask that you please call the office to avoid delays. Our number is [phone]. MyChart is not constantly monitored and due to the large volume of messages a day, replies may take up to 72 business hours.  MyChart Policy: MyChart allows for you to see your visit notes, after visit summary, provider recommendations, lab and tests results, make an appointment, request refills, and contact your provider or the office for non-urgent questions or concerns. Providers are seeing patients during normal business hours and do not have built in time to review MyChart messages.  We ask that you allow a minimum of 3 business days for responses to [employer]. For this reason, please do not send urgent requests through [place]. Please call the office at [phone]. New and ongoing conditions may require a visit. We have virtual and in person visit available for your convenience.  Complex MyChart concerns may require a visit. Your provider may request you schedule a virtual or in person visit to ensure we are providing the best care possible. MyChart messages sent after 11:00 AM on Friday will not be received by the  provider until Monday morning.    Lab and Test Results: You will receive your lab and test results on MyChart as soon as they are completed and results have been sent by the lab or testing facility. Due to this service, you will receive your results BEFORE your provider.  I review lab and tests results each morning prior to seeing patients. Some results require collaboration with other providers to ensure you are receiving the most  appropriate care. For this reason, we ask that you please allow a minimum of 3-5 business days from the time the ALL results have been received for your provider to receive and review lab and test results and contact you about these.  Most lab and test result comments from the provider will be sent through [place]. Your provider may recommend changes to the plan of care, follow-up visits, repeat testing, ask questions, or request an office visit to discuss these results. You may reply directly to this message or call the office at [phone] to provide information for the provider or set up an appointment. In some instances, you will be called with test results and recommendations. Please let us know if this is preferred and we will make note of this in your chart to provide this for you.    If you have not heard a response to your lab or test results in 5 business days from all results returning to [place], please call the office to let us know. We ask that you please avoid calling prior to this time unless there is an emergent concern. Due to high call volumes, this can delay the resulting process.  After Hours: For all non-emergency after hours needs, please call the office at [phone] and select the option to reach the on-call provider service. On-call services are shared between multiple [place] offices and therefore it will not be possible to speak directly with your provider. On-call providers may provide medical advice and recommendations, but are unable to provide refills for maintenance medications.  For all emergency or urgent medical needs after normal business hours, we recommend that you seek care at the closest Urgent Care or Emergency Department to ensure appropriate treatment in a timely manner.  [facility] [place] has a 24 hour emergency room located on the ground floor for your convenience.   Urgent Concerns During the Business Day Providers are seeing patients  from 8AM to [place] with a busy schedule and are most often not able to respond to non-urgent calls until the end of the day or the next business day. If you should have URGENT concerns during the day, please call and speak to the nurse or schedule a same day appointment so that we can address your concern without delay.   Thank you, again, for choosing me as your health care partner. I appreciate your trust and look forward to learning more about you.   Worthy Keeler, DNP, AGNP-c ___________________________________________________________  Health Maintenance Recommendations Screening Testing Mammogram Every 1 -2 years based on history and risk factors Starting at age 36 Pap Smear Ages 21-39 every 3 years Ages 66-65 every 5 years with HPV testing More frequent testing may be required based on results and history Colon Cancer Screening Every 1-10 years based on test performed, risk factors, and history Starting at age 34 Bone Density Screening Every 2-10 years based on history Starting at age 72 for women Recommendations for men differ based on medication usage, history, and risk factors AAA Screening One time  ultrasound Men 67-75 years old who have every smoked Lung Cancer Screening Low Dose Lung CT every 12 months Age 70-80 years with a 30 pack-year smoking history who still smoke or who have quit within the last 15 years  Screening Labs Routine  Labs: Complete Blood Count (CBC), Complete Metabolic Panel (CMP), Cholesterol (Lipid Panel) Every 6-12 months based on history and medications May be recommended more frequently based on current conditions or previous results Hemoglobin A1c Lab Every 3-12 months based on history and previous results Starting at age 4 or earlier with diagnosis of diabetes, high cholesterol, BMI >26, and/or risk factors Frequent monitoring for patients with diabetes to ensure blood sugar control Thyroid Panel (TSH w/ T3 & T4) Every 6 months based on  history, symptoms, and risk factors May be repeated more often if on medication HIV One time testing for all patients 87 and older May be repeated more frequently for patients with increased risk factors or exposure Hepatitis C One time testing for all patients 18 and older May be repeated more frequently for patients with increased risk factors or exposure Gonorrhea, Chlamydia Every 12 months for all sexually active persons 13-24 years Additional monitoring may be recommended for those who are considered high risk or who have symptoms PSA Men 79-7 years old with risk factors Additional screening may be recommended from age 23-69 based on risk factors, symptoms, and history  Vaccine Recommendations Tetanus Booster All adults every 10 years Flu Vaccine All patients 6 months and older every year COVID Vaccine All patients 12 years and older Initial dosing with booster May recommend additional booster based on age and health history HPV Vaccine 2 doses all patients age 47-26 Dosing may be considered for patients over 26 Shingles Vaccine (Shingrix) 2 doses all adults 3 years and older Pneumonia (Pneumovax 23) All adults 84 years and older May recommend earlier dosing based on health history Pneumonia (Prevnar 93) All adults 63 years and older Dosed 1 year after Pneumovax 23  Additional Screening, Testing, and Vaccinations may be recommended on an individualized basis based on family history, health history, risk factors, and/or exposure.  __________________________________________________________  Diet Recommendations for All Patients  I recommend that all patients maintain a diet low in saturated fats, carbohydrates, and cholesterol. While this can be challenging at first, it is not impossible and small changes can make big differences.  Things to try: Decreasing the amount of soda, sweet tea, and/or juice to one or less per day and replace with water While water is always  the first choice, if you do not like water you may consider adding a water additive without sugar to improve the taste other sugar free drinks Replace potatoes with a brightly colored vegetable at dinner Use healthy oils, such as canola oil or olive oil, instead of butter or hard margarine Limit your bread intake to two pieces or less a day Replace regular pasta with low carb pasta options Bake, broil, or grill foods instead of frying Monitor portion sizes  Eat smaller, more frequent meals throughout the day instead of large meals  An important thing to remember is, if you love foods that are not great for your health, you don't have to give them up completely. Instead, allow these foods to be a reward when you have done well. Allowing yourself to still have special treats every once in a while is a nice way to tell yourself thank you for working hard to keep yourself healthy.   Also remember  that every day is a new day. If you have a bad day and "fall off the wagon", you can still climb right back up and keep moving along on your journey!  We have resources available to help you!  Some websites that may be helpful include: www.http://[place].biz/  Www.[employer]Well[employer].com _____________________________________________________________  Activity Recommendations for All Patients  I recommend that all adults get at least 20 minutes of moderate physical activity that elevates your heart rate at least 5 days out of the week.  Some examples include: Walking or jogging at a pace that allows you to carry on a conversation Cycling (stationary bike or outdoors) Water aerobics Yoga Weight lifting Dancing If physical limitations prevent you from putting stress on your joints, exercise in a pool or seated in a chair are excellent options.  Do determine your MAXIMUM heart rate for activity: YOUR AGE - 220 = MAX HeartRate   Remember! Do not push yourself too hard.  Start slowly and build up your pace, speed,  weight, time in exercise, etc.  Allow your body to rest between exercise and get good sleep. You will need more water than normal when you are exerting yourself. Do not wait until you are thirsty to drink. Drink with a purpose of getting in at least 8, 8 ounce glasses of water a day plus more depending on how much you exercise and sweat.    If you begin to develop dizziness, chest pain, abdominal pain, jaw pain, shortness of breath, headache, vision changes, lightheadedness, or other concerning symptoms, stop the activity and allow your body to rest. If your symptoms are severe, seek emergency evaluation immediately. If your symptoms are concerning, but not severe, please let us know so that we can recommend further evaluation.

## 2021-11-28 LAB — CBC WITH DIFF/PLATELET
Basophils Absolute: 0.1 10*3/uL (ref 0.0–0.2)
Basos: 1 %
EOS (ABSOLUTE): 0.1 10*3/uL (ref 0.0–0.4)
Eos: 2 %
Hematocrit: 34.4 % (ref 34.0–46.6)
Hemoglobin: 11.5 g/dL (ref 11.1–15.9)
Immature Grans (Abs): 0 10*3/uL (ref 0.0–0.1)
Immature Granulocytes: 0 %
Lymphocytes Absolute: 1.5 10*3/uL (ref 0.7–3.1)
Lymphs: 26 %
MCH: 29.9 pg (ref 26.6–33.0)
MCHC: 33.4 g/dL (ref 31.5–35.7)
MCV: 89 fL (ref 79–97)
Monocytes Absolute: 0.4 10*3/uL (ref 0.1–0.9)
Monocytes: 6 %
Neutrophils Absolute: 3.9 10*3/uL (ref 1.4–7.0)
Neutrophils: 65 %
Platelets: 254 10*3/uL (ref 150–450)
RBC: 3.85 x10E6/uL (ref 3.77–5.28)
RDW: 11.9 % (ref 11.7–15.4)
WBC: 5.9 10*3/uL (ref 3.4–10.8)

## 2021-11-28 LAB — THYROID PANEL WITH TSH
Free Thyroxine Index: 2.6 (ref 1.2–4.9)
T3 Uptake Ratio: 30 % (ref 24–39)
T4, Total: 8.6 ug/dL (ref 4.5–12.0)
TSH: 1.82 u[IU]/mL (ref 0.450–4.500)

## 2021-11-28 LAB — COMPREHENSIVE METABOLIC PANEL
ALT: 24 IU/L (ref 0–32)
AST: 27 IU/L (ref 0–40)
Albumin/Globulin Ratio: 1.9 (ref 1.2–2.2)
Albumin: 4.6 g/dL (ref 3.9–4.9)
Alkaline Phosphatase: 67 IU/L (ref 44–121)
BUN/Creatinine Ratio: 24 — ABNORMAL HIGH (ref 9–23)
BUN: 18 mg/dL (ref 6–24)
Bilirubin Total: 0.3 mg/dL (ref 0.0–1.2)
CO2: 20 mmol/L (ref 20–29)
Calcium: 9.2 mg/dL (ref 8.7–10.2)
Chloride: 103 mmol/L (ref 96–106)
Creatinine, Ser: 0.76 mg/dL (ref 0.57–1.00)
Globulin, Total: 2.4 g/dL (ref 1.5–4.5)
Glucose: 139 mg/dL — ABNORMAL HIGH (ref 70–99)
Potassium: 4.4 mmol/L (ref 3.5–5.2)
Sodium: 139 mmol/L (ref 134–144)
Total Protein: 7 g/dL (ref 6.0–8.5)
eGFR: 100 mL/min/{1.73_m2} (ref >59)

## 2021-11-28 LAB — VITAMIN D 25 HYDROXY (VIT D DEFICIENCY, FRACTURES): Vit D, 25-Hydroxy: 64.5 ng/mL (ref 30.0–100.0)

## 2021-12-07 ENCOUNTER — Ambulatory Visit: Payer: 59 | Admitting: Family

## 2021-12-17 ENCOUNTER — Other Ambulatory Visit (HOSPITAL_BASED_OUTPATIENT_CLINIC_OR_DEPARTMENT_OTHER): Payer: Self-pay | Admitting: Nurse Practitioner

## 2021-12-17 ENCOUNTER — Encounter (HOSPITAL_BASED_OUTPATIENT_CLINIC_OR_DEPARTMENT_OTHER): Payer: Self-pay | Admitting: Nurse Practitioner

## 2021-12-17 DIAGNOSIS — R110 Nausea: Secondary | ICD-10-CM

## 2021-12-17 MED ORDER — ONDANSETRON HCL 8 MG PO TABS: 8.0000 mg | ORAL_TABLET | Freq: Three times a day (TID) | ORAL | 3 refills | Status: DC | PRN

## 2021-12-28 ENCOUNTER — Ambulatory Visit (HOSPITAL_BASED_OUTPATIENT_CLINIC_OR_DEPARTMENT_OTHER)
Admission: RE | Admit: 2021-12-28 | Discharge: 2021-12-28 | Disposition: A | Discharge status: Home / Self Care | Payer: No Typology Code available for payment source | Source: Ambulatory Visit | Attending: Nurse Practitioner | Admitting: Nurse Practitioner

## 2021-12-28 DIAGNOSIS — Z1231 Encounter for screening mammogram for malignant neoplasm of breast: Secondary | ICD-10-CM | POA: Insufficient documentation

## 2022-12-22 DIAGNOSIS — H35411 Lattice degeneration of retina, right eye: Secondary | ICD-10-CM | POA: Diagnosis not present

## 2022-12-22 DIAGNOSIS — H35412 Lattice degeneration of retina, left eye: Secondary | ICD-10-CM | POA: Diagnosis not present

## 2022-12-22 DIAGNOSIS — H1045 Other chronic allergic conjunctivitis: Secondary | ICD-10-CM | POA: Diagnosis not present

## 2023-01-18 DIAGNOSIS — L570 Actinic keratosis: Secondary | ICD-10-CM | POA: Diagnosis not present

## 2023-03-31 ENCOUNTER — Ambulatory Visit
Admission: RE | Admit: 2023-03-31 | Discharge: 2023-03-31 | Disposition: A | Discharge status: Home / Self Care | Payer: 59 | Source: Ambulatory Visit | Attending: Family Medicine | Admitting: Family Medicine

## 2023-03-31 VITALS — BP 101/69 | HR 105 | Temp 98.7°F | Resp 16

## 2023-03-31 DIAGNOSIS — R112 Nausea with vomiting, unspecified: Secondary | ICD-10-CM | POA: Diagnosis not present

## 2023-03-31 DIAGNOSIS — B349 Viral infection, unspecified: Secondary | ICD-10-CM | POA: Diagnosis not present

## 2023-03-31 DIAGNOSIS — J029 Acute pharyngitis, unspecified: Secondary | ICD-10-CM | POA: Diagnosis not present

## 2023-03-31 LAB — POCT RAPID STREP A (OFFICE): Rapid Strep A Screen: NEGATIVE

## 2023-03-31 LAB — POCT INFLUENZA A/B
Influenza A, POC: NEGATIVE
Influenza B, POC: NEGATIVE

## 2023-03-31 MED ORDER — ONDANSETRON 8 MG PO TBDP: 8.0000 mg | ORAL_TABLET | Freq: Three times a day (TID) | ORAL | 0 refills | Status: AC | PRN

## 2023-03-31 NOTE — ED Triage Notes (Signed)
 Pt presents with c/o fever, sore throat and vomiting X 48 hours.   Home interventions: Motrin, Tylenol and 8mg  Zofran

## 2023-03-31 NOTE — ED Provider Notes (Signed)
 [facility]    CSN: [id number] Arrival date & time: 03/31/23  9146      History   Chief Complaint Chief Complaint  Patient presents with   Fever    Entered by patient   Sore Throat    HPI Yolanda Christian is a 43 y.o. female  presents for evaluation of URI symptoms for 2 days. Patient reports associated symptoms of fever 102, sore throat, vomiting, mild congestion, body aches. Denies diarrhea, cough, ear pain, shortness of breath. Patient does not have a hx of asthma. Patient not not an active smoker.   Reports  sick contacts via daughter.  Pt has taken Tylenol , Motrin , and Zofran  OTC for symptoms. Pt has no other concerns at this time.    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   Hemorrhoids    Normal pregnancy, repeat 02/16/2012    Patient Active Problem List   Diagnosis Date Noted   Encounter for annual physical exam 11/27/2021   Recent skin changes 11/27/2021    Past Surgical History:  Procedure Laterality Date   ADENOIDECTOMY     CESAREAN SECTION N/A 06/18/2014   Procedure: CESAREAN SECTION;  Surgeon: Debby Lares, MD;  Location: [facility];  Service: Obstetrics;  Laterality: N/A;   COLPOSCOPY      OB History     Gravida  3   Para  3   Term  2   Preterm  1   AB      Living  4      SAB      IAB      Ectopic      Multiple  1   Live Births  4            Home Medications    Prior to Admission medications   Medication Sig Start Date End Date Taking? Authorizing Provider  ondansetron  (ZOFRAN -ODT) 8 MG disintegrating tablet Take 1 tablet (8 mg total) by mouth every 8 (eight) hours as needed for nausea or vomiting. 03/31/23  Yes Anja Neuzil, Jodi R, NP  ibuprofen  (ADVIL ,MOTRIN ) 600 MG tablet Take 1 tablet (600 mg total) by mouth every 6 (six) hours. 06/21/14   Meisinger, Krystal, MD     Family History Family History  Problem Relation Age of Onset   Hypertension Father    Diabetes Father    Cancer Maternal Grandfather        lung    Social History Social History   Tobacco Use   Smoking status: Never   Smokeless tobacco: Never  Substance Use Topics   Alcohol use: No   Drug use: No     Allergies   Patient has no known allergies.   Review of Systems Review of Systems  Constitutional:  Positive for fever.  HENT:  Positive for congestion and sore throat.   Gastrointestinal:  Positive for vomiting.  Musculoskeletal:  Positive for myalgias.     Physical Exam Triage Vital Signs ED Triage Vitals  Encounter Vitals Group     BP 03/31/23 0902 101/69     Systolic BP Percentile --      Diastolic BP Percentile --      Pulse Rate 03/31/23 0902 (!) 105     Resp 03/31/23 0902 16     Temp 03/31/23 0902 98.7 F (37.1 C)  Temp Source 03/31/23 0902 Oral     SpO2 03/31/23 0902 96 %     Weight --      Height --      Head Circumference --      Peak Flow --      Pain Score 03/31/23 0901 0     Pain Loc --      Pain Education --      Exclude from Growth Chart --    No data found.  Updated Vital Signs BP 101/69 (BP Location: Right Arm)   Pulse (!) 105   Temp 98.7 F (37.1 C) (Oral)   Resp 16   LMP 03/15/2023 (Exact Date)   SpO2 96%   Breastfeeding No   Visual Acuity Right Eye Distance:   Left Eye Distance:   Bilateral Distance:    Right Eye Near:   Left Eye Near:    Bilateral Near:     Physical Exam Vitals and nursing note reviewed.  Constitutional:      General: She is not in acute distress.    Appearance: She is well-developed. She is not ill-appearing.  HENT:     Head: Normocephalic and atraumatic.     Right Ear: Tympanic membrane and ear canal normal.     Left Ear: Tympanic membrane and ear canal normal.     Nose: Congestion present.     Mouth/Throat:     Mouth: Mucous membranes are moist.     Pharynx: Oropharynx is clear.  Uvula midline. Posterior oropharyngeal erythema present.     Tonsils: No tonsillar exudate or tonsillar abscesses.  Eyes:     Conjunctiva/sclera: Conjunctivae normal.     Pupils: Pupils are equal, round, and reactive to light.  Cardiovascular:     Rate and Rhythm: Normal rate and regular rhythm.     Heart sounds: Normal heart sounds.  Pulmonary:     Effort: Pulmonary effort is normal.     Breath sounds: Normal breath sounds.  Musculoskeletal:     Cervical back: Normal range of motion and neck supple.  Lymphadenopathy:     Cervical: No cervical adenopathy.  Skin:    General: Skin is warm and dry.  Neurological:     General: No focal deficit present.     Mental Status: She is alert and oriented to person, place, and time.  Psychiatric:        Mood and Affect: Mood normal.        Behavior: Behavior normal.      UC Treatments / Results  Labs (all labs ordered are listed, but only abnormal results are displayed) Labs Reviewed  SARS CORONAVIRUS 2 (TAT 6-24 HRS)  CULTURE, GROUP A STREP [facility])  POCT RAPID STREP A (OFFICE)  POCT INFLUENZA A/B    EKG   Radiology No results found.  Procedures Procedures (including critical care time)  Medications Ordered in UC Medications - No data to display  Initial Impression / Assessment and Plan / UC Course  I have reviewed the triage vital signs and the nursing notes.  Pertinent labs & imaging results that were available during my care of the patient were reviewed by me and considered in my medical decision making (see chart for details).     Reviewed exam and symptoms with patient.  No red flags.  Negative rapid strep, will culture.  COVID PCR and will contact for any positive results.  Negative rapid flu testing.  Discussed viral illness and symptomatic treatment.  Patient requested refill of  her Zofran , sent to pharmacy.  PCP follow-up as symptoms do not improve.  ER precautions reviewed. Final Clinical Impressions(s) / UC  Diagnoses   Final diagnoses:  Sore throat  Viral illness  Nausea and vomiting, unspecified vomiting type     Discharge Instructions      The clinic will contact you with results of the COVID test done today if positive.  I have refilled your Zofran  as needed for nausea/vomiting.  You may do salt water gargles and warm liquids such as teas with honey.  Over-the-counter Tylenol  or ibuprofen  as needed.  Lots of rest and fluids.  Please follow-up with your PCP if your symptoms do not improve.  Please go to the ER if you develop any worsening symptoms.  I hope you feel better soon!     ED Prescriptions     Medication Sig Dispense Auth. Provider   ondansetron  (ZOFRAN -ODT) 8 MG disintegrating tablet Take 1 tablet (8 mg total) by mouth every 8 (eight) hours as needed for nausea or vomiting. 8 tablet Yostin Malacara, Jodi R, NP      PDMP not reviewed this encounter.   Loreda Myla SAUNDERS, NP 03/31/23 [phone]

## 2023-03-31 NOTE — Discharge Instructions (Signed)
 The clinic will contact you with results of the COVID test done today if positive.  I have refilled your Zofran  as needed for nausea/vomiting.  You may do salt water gargles and warm liquids such as teas with honey.  Over-the-counter Tylenol  or ibuprofen  as needed.  Lots of rest and fluids.  Please follow-up with your PCP if your symptoms do not improve.  Please go to the ER if you develop any worsening symptoms.  I hope you feel better soon!

## 2023-04-01 LAB — SARS CORONAVIRUS 2 (TAT 6-24 HRS): SARS Coronavirus 2: NEGATIVE

## 2023-04-03 LAB — CULTURE, GROUP A STREP (THRC)

## 2023-04-21 DIAGNOSIS — H1045 Other chronic allergic conjunctivitis: Secondary | ICD-10-CM | POA: Diagnosis not present

## 2023-04-21 DIAGNOSIS — H35412 Lattice degeneration of retina, left eye: Secondary | ICD-10-CM | POA: Diagnosis not present

## 2023-04-21 DIAGNOSIS — H35411 Lattice degeneration of retina, right eye: Secondary | ICD-10-CM | POA: Diagnosis not present

## 2023-11-02 DIAGNOSIS — Z130 Encounter for screening for diseases of the blood and blood-forming organs and certain disorders involving the immune mechanism: Secondary | ICD-10-CM | POA: Diagnosis not present

## 2023-11-02 DIAGNOSIS — Z1389 Encounter for screening for other disorder: Secondary | ICD-10-CM | POA: Diagnosis not present

## 2023-11-02 DIAGNOSIS — Z01419 Encounter for gynecological examination (general) (routine) without abnormal findings: Secondary | ICD-10-CM | POA: Diagnosis not present

## 2023-11-02 DIAGNOSIS — T753XXS Motion sickness, sequela: Secondary | ICD-10-CM | POA: Diagnosis not present

## 2023-11-02 DIAGNOSIS — Z124 Encounter for screening for malignant neoplasm of cervix: Secondary | ICD-10-CM | POA: Diagnosis not present

## 2023-11-02 DIAGNOSIS — K644 Residual hemorrhoidal skin tags: Secondary | ICD-10-CM | POA: Diagnosis not present

## 2023-11-02 DIAGNOSIS — N920 Excessive and frequent menstruation with regular cycle: Secondary | ICD-10-CM | POA: Diagnosis not present

## 2023-11-02 DIAGNOSIS — Z01411 Encounter for gynecological examination (general) (routine) with abnormal findings: Secondary | ICD-10-CM | POA: Diagnosis not present

## 2023-11-02 DIAGNOSIS — Z1151 Encounter for screening for human papillomavirus (HPV): Secondary | ICD-10-CM | POA: Diagnosis not present

## 2023-12-21 ENCOUNTER — Other Ambulatory Visit: Payer: Self-pay | Admitting: Obstetrics and Gynecology

## 2023-12-21 DIAGNOSIS — Z1231 Encounter for screening mammogram for malignant neoplasm of breast: Secondary | ICD-10-CM

## 2023-12-23 ENCOUNTER — Other Ambulatory Visit (HOSPITAL_BASED_OUTPATIENT_CLINIC_OR_DEPARTMENT_OTHER): Payer: Self-pay

## 2023-12-23 ENCOUNTER — Telehealth: Admitting: Physician Assistant

## 2023-12-23 DIAGNOSIS — L03019 Cellulitis of unspecified finger: Secondary | ICD-10-CM

## 2023-12-23 MED ORDER — CEPHALEXIN 500 MG PO CAPS
500.0000 mg | ORAL_CAPSULE | Freq: Four times a day (QID) | ORAL | 0 refills | Status: AC
  Filled 2023-12-23: qty 20, 5d supply, fill #0

## 2023-12-23 NOTE — Progress Notes (Signed)
 E-Visit for Cellulitis  We are sorry that you are not feeling well. Here is how we plan to help!  Based on what you shared with me it looks like you have paronychia, a bacterial infection around the nail bed.   I have prescribed:  Keflex  500mg  take one by mouth four times a day for 5 days  HOME CARE:  Take your medications as ordered and take all of them, even if the skin irritation appears to be healing.   GET HELP RIGHT AWAY IF:  Symptoms that don't begin to go away within 48 hours. Severe redness persists or worsens If the area turns color, spreads or swells. If it blisters and opens, develops yellow-brown crust or bleeds. You develop a fever or chills. If the pain increases or becomes unbearable.  Are unable to keep fluids and food down.  MAKE SURE YOU   Understand these instructions. Will watch your condition. Will get help right away if you are not doing well or get worse.  Thank you for choosing an e-visit.  Your e-visit answers were reviewed by a board certified advanced clinical practitioner to complete your personal care plan. Depending upon the condition, your plan could have included both over the counter or prescription medications.  Please review your pharmacy choice. Make sure the pharmacy is open so you can pick up prescription now. If there is a problem, you may contact your provider through [employer] and have the prescription routed to another pharmacy.  Your safety is important to us . If you have drug allergies check your prescription carefully.   For the next 24 hours you can use MyChart to ask questions about today's visit, request a non-urgent call back, or ask for a work or school excuse. You will get an email in the next two days asking about your experience. I hope that your e-visit has been valuable and will speed your recovery.    I have spent 5 minutes in review of e-visit questionnaire, review and updating patient chart, medical decision  making and response to patient.   Delon CHRISTELLA Dickinson, PA-C

## 2024-01-03 ENCOUNTER — Ambulatory Visit
Admission: RE | Admit: 2024-01-03 | Discharge: 2024-01-03 | Disposition: A | Discharge status: Home / Self Care | Source: Ambulatory Visit

## 2024-01-03 DIAGNOSIS — Z1231 Encounter for screening mammogram for malignant neoplasm of breast: Secondary | ICD-10-CM | POA: Diagnosis not present
